# Patient Record
Sex: Female | Born: 1980 | Race: White | Hispanic: No | Marital: Single | Smoking: Never smoker
Health system: Southern US, Community
[De-identification: ages and names within clinical notes are randomized; demographics above are authoritative.]

## PROBLEM LIST (undated history)

## (undated) DIAGNOSIS — M5430 Sciatica, unspecified side: Secondary | ICD-10-CM

## (undated) DIAGNOSIS — E739 Lactose intolerance, unspecified: Secondary | ICD-10-CM

## (undated) DIAGNOSIS — G43909 Migraine, unspecified, not intractable, without status migrainosus: Secondary | ICD-10-CM

## (undated) HISTORY — DX: Lactose intolerance, unspecified: E73.9

## (undated) HISTORY — DX: Migraine, unspecified, not intractable, without status migrainosus: G43.909

## (undated) HISTORY — DX: Sciatica, unspecified side: M54.30

---

## 2008-11-01 LAB — HM PAP SMEAR

## 2013-09-21 ENCOUNTER — Ambulatory Visit: Payer: 59 | Admitting: Family Medicine

## 2013-11-01 ENCOUNTER — Telehealth: Payer: Self-pay

## 2013-11-01 NOTE — Telephone Encounter (Signed)
Left message for call back Identifiable  NEW PATIENT     

## 2013-11-01 NOTE — Telephone Encounter (Signed)
Patient called returning your phone call. Please advise.  

## 2013-11-01 NOTE — Telephone Encounter (Signed)
Medication and allergies:  Reviewed and updated  90 day supply/mail order: n/a [facility]  [id number] - [place], [place] - [address]   Immunizations due:  UTD   A/P: Personal, family history and past surgical hx: Reviewed and updated PAP- 4-5 years ago--normal per patient Flu- 02/2013 per patient; received at work Tdap- within the last 10 years per patient  Patient plans to fax recent lab results to office.   To Discuss with Provider: Nothing at this time.

## 2013-11-02 ENCOUNTER — Other Ambulatory Visit (HOSPITAL_COMMUNITY)
Admission: RE | Admit: 2013-11-02 | Discharge: 2013-11-02 | Disposition: A | Discharge status: Home / Self Care | Payer: 59 | Source: Ambulatory Visit | Attending: Family Medicine | Admitting: Family Medicine

## 2013-11-02 ENCOUNTER — Ambulatory Visit (INDEPENDENT_AMBULATORY_CARE_PROVIDER_SITE_OTHER): Payer: 59 | Admitting: Family Medicine

## 2013-11-02 ENCOUNTER — Encounter: Payer: Self-pay | Admitting: Family Medicine

## 2013-11-02 VITALS — BP 130/70 | HR 89 | Temp 98.1°F | Ht 66.0 in | Wt 153.4 lb

## 2013-11-02 DIAGNOSIS — Z01419 Encounter for gynecological examination (general) (routine) without abnormal findings: Secondary | ICD-10-CM | POA: Insufficient documentation

## 2013-11-02 DIAGNOSIS — Z1151 Encounter for screening for human papillomavirus (HPV): Secondary | ICD-10-CM | POA: Insufficient documentation

## 2013-11-02 DIAGNOSIS — Z0000 Encounter for general adult medical examination without abnormal findings: Secondary | ICD-10-CM

## 2013-11-02 NOTE — Patient Instructions (Signed)

## 2013-11-02 NOTE — Progress Notes (Signed)
Subjective:     Amanda Wood is a 33 y.o. female and is here for a comprehensive physical exam. The patient reports no problems.  History   Social History  . Marital Status: Single    Spouse Name: N/A    Number of Children: N/A  . Years of Education: N/A   Occupational History  . Not on file.   Social History Main Topics  . Smoking status: Never Smoker   . Smokeless tobacco: Not on file  . Alcohol Use: Yes     Comment: 1-2 glass of wine a week  . Drug Use: No  . Sexual Activity: Not on file   Other Topics Concern  . Not on file   Social History Narrative  . No narrative on file   Health Maintenance  Topic Date Due  . Tetanus/tdap  09/06/1997  . Pap Smear  11/02/2011  . Influenza Vaccine  01/19/2014    The following portions of the patient's history were reviewed and updated as appropriate:  She  has a past medical history of Sciatica; Migraines; and Lactose intolerance. She  does not have a problem list on file. She  has no past surgical history on file. Her family history includes Dementia in her maternal grandmother; Hypertension in her father; Stroke in her paternal grandmother. She  reports that she has never smoked. She does not have any smokeless tobacco history on file. She reports that she drinks alcohol. She reports that she does not use illicit drugs. She has a current medication list which includes the following prescription(s): multivitamin with minerals. Current Outpatient Prescriptions on File Prior to Visit  Medication Sig Dispense Refill  . Multiple Vitamins-Minerals (MULTIVITAMIN WITH MINERALS) tablet Take 1 tablet by mouth daily.       No current facility-administered medications on file prior to visit.   She has No Known Allergies..  Review of Systems Review of Systems  Constitutional: Negative for activity change, appetite change and fatigue.  HENT: Negative for hearing loss, congestion, tinnitus and ear discharge.  dentist q1647m Eyes:  Negative for visual disturbance (see optho q1y -- vision corrected to 20/20 with glasses).  Respiratory: Negative for cough, chest tightness and shortness of breath.   Cardiovascular: Negative for chest pain, palpitations and leg swelling.  Gastrointestinal: Negative for abdominal pain, diarrhea, constipation and abdominal distention.  Genitourinary: Negative for urgency, frequency, decreased urine volume and difficulty urinating.  Musculoskeletal: Negative for back pain, arthralgias and gait problem.  Skin: Negative for color change, pallor and rash.  Neurological: Negative for dizziness, light-headedness, numbness and headaches.  Hematological: Negative for adenopathy. Does not bruise/bleed easily.  Psychiatric/Behavioral: Negative for suicidal ideas, confusion, sleep disturbance, self-injury, dysphoric mood, decreased concentration and agitation.       Objective:    BP 130/70  Pulse 89  Temp(Src) 98.1 F (36.7 C) (Oral)  Ht 5\' 6"  (1.676 m)  Wt 153 lb 6.4 oz (69.582 kg)  BMI 24.77 kg/m2  SpO2 96%  LMP 10/07/2013 General appearance: alert, cooperative, appears stated age and no distress Head: Normocephalic, without obvious abnormality, atraumatic Eyes: conjunctivae/corneas clear. PERRL, EOM's intact. Fundi benign. Ears: normal TM's and external ear canals both ears Nose: Nares normal. Septum midline. Mucosa normal. No drainage or sinus tenderness. Throat: lips, mucosa, and tongue normal; teeth and gums normal Neck: no adenopathy, no carotid bruit, no JVD, supple, symmetrical, trachea midline and thyroid not enlarged, symmetric, no tenderness/mass/nodules Back: symmetric, no curvature. ROM normal. No CVA tenderness. Lungs: clear to auscultation bilaterally  Breasts: normal appearance, no masses or tenderness Heart: S1, S2 normal Abdomen: soft, non-tender; bowel sounds normal; no masses,  no organomegaly Pelvic: cervix normal in appearance, external genitalia normal, no adnexal  masses or tenderness, no cervical motion tenderness, rectovaginal septum normal, uterus normal size, shape, and consistency, vagina normal without discharge and pap done Extremities: extremities normal, atraumatic, no cyanosis or edema Pulses: 2+ and symmetric Skin: Skin color, texture, turgor normal. No rashes or lesions Lymph nodes: Cervical, supraclavicular, and axillary nodes normal. Neurologic: Alert and oriented X 3, normal strength and tone. Normal symmetric reflexes. Normal coordination and gait Psych-- no depression. No anxiety      Assessment:    Healthy female exam.      Plan:    ghm utd Check labs--- labs from drs day reveiwed  See After Visit Summary for Counseling Recommendations

## 2013-11-02 NOTE — Progress Notes (Signed)
Pre visit review using our clinic review tool, if applicable. No additional management support is needed unless otherwise documented below in the visit note. 

## 2013-11-19 ENCOUNTER — Encounter: Payer: Self-pay | Admitting: Family Medicine

## 2014-01-21 ENCOUNTER — Encounter: Payer: Self-pay | Admitting: Family Medicine

## 2014-01-29 ENCOUNTER — Encounter: Payer: Self-pay | Admitting: Family Medicine

## 2014-01-29 ENCOUNTER — Ambulatory Visit (INDEPENDENT_AMBULATORY_CARE_PROVIDER_SITE_OTHER): Payer: 59 | Admitting: Family Medicine

## 2014-01-29 ENCOUNTER — Other Ambulatory Visit (HOSPITAL_COMMUNITY)
Admission: RE | Admit: 2014-01-29 | Discharge: 2014-01-29 | Disposition: A | Discharge status: Home / Self Care | Payer: 59 | Source: Ambulatory Visit | Attending: Family Medicine | Admitting: Family Medicine

## 2014-01-29 VITALS — BP 120/70 | HR 73 | Temp 98.4°F | Wt 152.0 lb

## 2014-01-29 DIAGNOSIS — G43829 Menstrual migraine, not intractable, without status migrainosus: Secondary | ICD-10-CM

## 2014-01-29 DIAGNOSIS — Z124 Encounter for screening for malignant neoplasm of cervix: Secondary | ICD-10-CM

## 2014-01-29 DIAGNOSIS — Z30011 Encounter for initial prescription of contraceptive pills: Secondary | ICD-10-CM

## 2014-01-29 DIAGNOSIS — Z01419 Encounter for gynecological examination (general) (routine) without abnormal findings: Secondary | ICD-10-CM | POA: Diagnosis present

## 2014-01-29 DIAGNOSIS — Z3009 Encounter for other general counseling and advice on contraception: Secondary | ICD-10-CM

## 2014-01-29 DIAGNOSIS — R87616 Satisfactory cervical smear but lacking transformation zone: Secondary | ICD-10-CM

## 2014-01-29 MED ORDER — ZOLMITRIPTAN 5 MG PO TABS: 5.0000 mg | ORAL_TABLET | ORAL | Status: DC | PRN

## 2014-01-29 MED ORDER — LEVONORGEST-ETH ESTRAD 91-DAY 0.15-0.03 &0.01 MG PO TABS: 1.0000 | ORAL_TABLET | Freq: Every day | ORAL | Status: DC

## 2014-01-29 NOTE — Progress Notes (Signed)
  Subjective:     Amanda Wood is a 33 y.o. woman who comes in today for a  pap smear only. Her most recent annual exam was on 11/02/2013. Her most recent Pap smear was on 11/02/2013 and showed no EC cells. Previous abnormal Pap smears: no. Contraception: OCP (estrogen/progesterone) Pt also c/o menstrual migraines-- that are worsening.  Occurs every month.   The following portions of the patient's history were reviewed and updated as appropriate: allergies, current medications, past family history, past medical history, past social history, past surgical history and problem list.  Review of Systems Pertinent items are noted in HPI.   Objective:    BP 120/70  Pulse 73  Temp(Src) 98.4 F (36.9 C) (Oral)  Wt 152 lb (68.947 kg)  SpO2 97%  LMP 01/06/2014 Pelvic Exam: cervix normal in appearance, external genitalia normal, urethra without abnormality or discharge, vagina normal without discharge and repeat pap done. Pap smear obtained.   Assessment:    Screening pap smear.   Plan:    Follow up in 6 months, or as indicated by Pap results.   1. Encounter for initial prescription of contraceptive pills  - Levonorgestrel-Ethinyl Estradiol (AMETHIA,CAMRESE) 0.15-0.03 &0.01 MG tablet; Take 1 tablet by mouth daily.  Dispense: 1 Package; Refill: 4  2. Menstrual migraine without status migrainosus, not intractable seasonique - zolmitriptan (ZOMIG) 5 MG tablet; Take 1 tablet (5 mg total) by mouth as needed for migraine.  Dispense: 10 tablet; Refill: 0  3. Encounter for routine gynecological examination

## 2014-01-29 NOTE — Addendum Note (Signed)
Addended by: Arnette NorrisPAYNE, Adaley Kiene P on: 01/29/2014 05:12 PM   Modules accepted: Orders

## 2014-01-29 NOTE — Progress Notes (Signed)
Pre visit review using our clinic review tool, if applicable. No additional management support is needed unless otherwise documented below in the visit note. 

## 2014-01-29 NOTE — Patient Instructions (Signed)
Pap Test A Pap test is a procedure done in a clinic office to evaluate cells that are on the surface of the cervix. The cervix is the lower portion of the uterus and upper portion of the vagina. For some women, the cervical region has the potential to form cancer. With consistent evaluations by your caregiver, this type of cancer can be prevented.  If a Pap test is abnormal, it is most often a result of a previous exposure to human papillomavirus (HPV). HPV is a virus that can infect the cells of the cervix and cause dysplasia. Dysplasia is where the cells no longer look normal. If a woman has been diagnosed with high-grade or severe dysplasia, they are at higher risk of developing cervical cancer. People diagnosed with low-grade dysplasia should still be seen by their caregiver because there is a small chance that low-grade dysplasia could develop into cancer.  LET YOUR CAREGIVER KNOW ABOUT:  Recent sexually transmitted infection (STI) you have had.  Any new sex partners you have had.  History of previous abnormal Pap tests results.  History of previous cervical procedures you have had (colposcopy, biopsy, loop electrosurgical excision procedure [LEEP]).  Concerns you have had regarding unusual vaginal discharge.  History of pelvic pain.  Your use of birth control. BEFORE THE PROCEDURE  Ask your caregiver when to schedule your Pap test. It is best not to be on your period if your caregiver uses a wooden spatula to collect cells or applies cells to a glass slide. Newer techniques are not so sensitive to the timing of a menstrual cycle.  Do not douche or have sexual intercourse for 24 hours before the test.   Do not use vaginal creams or tampons for 24 hours before the test.   Empty your bladder just before the test to lessen any discomfort.  PROCEDURE You will lie on an exam table with your feet in stirrups. A warm metal or plastic instrument (speculum) is placed in your vagina. This  instrument allows your caregiver to see the inside of your vagina and look at your cervix. A small, plastic brush or wooden spatula is then used to collect cervical cells. These cells are placed in a lab specimen container. The cells are looked at under a microscope. A specialist will determine if the cells are normal.  AFTER THE PROCEDURE Make sure to get your test results.If your results come back abnormal, you may need further testing.  Document Released: 08/28/2002 Document Revised: 08/30/2011 Document Reviewed: 06/03/2011 [employer] Patient Information 2015 [employer]. This information is not intended to replace advice given to you by your health care provider. Make sure you discuss any questions you have with your health care provider.  

## 2014-02-01 LAB — CYTOLOGY - PAP

## 2014-07-25 ENCOUNTER — Other Ambulatory Visit: Payer: Self-pay

## 2014-07-25 DIAGNOSIS — G43829 Menstrual migraine, not intractable, without status migrainosus: Secondary | ICD-10-CM

## 2014-07-25 MED ORDER — ZOLMITRIPTAN 5 MG PO TABS: 5.0000 mg | ORAL_TABLET | ORAL | Status: DC | PRN

## 2014-09-22 ENCOUNTER — Encounter: Payer: Self-pay | Admitting: Family Medicine

## 2014-09-23 ENCOUNTER — Other Ambulatory Visit: Payer: Self-pay | Admitting: Family Medicine

## 2014-09-23 DIAGNOSIS — Z0000 Encounter for general adult medical examination without abnormal findings: Secondary | ICD-10-CM

## 2014-09-23 NOTE — Telephone Encounter (Signed)
Order for labs can be faxed to [facility] if she wants to go there  Or we can arrange easily for her to go to [facility]---  Bmp, hep, lipid, cbc, tsh

## 2014-10-18 ENCOUNTER — Telehealth: Payer: Self-pay | Admitting: Family Medicine

## 2014-10-18 NOTE — Telephone Encounter (Signed)
Pre visit letter for annual exam mailed °

## 2014-11-01 ENCOUNTER — Other Ambulatory Visit: Payer: Self-pay | Admitting: Family Medicine

## 2014-11-01 DIAGNOSIS — Z0000 Encounter for general adult medical examination without abnormal findings: Secondary | ICD-10-CM

## 2014-11-01 MED ORDER — NONFORMULARY OR COMPOUNDED ITEM: Status: DC

## 2014-11-07 ENCOUNTER — Encounter: Payer: Self-pay | Admitting: *Deleted

## 2014-11-07 ENCOUNTER — Telehealth: Payer: Self-pay | Admitting: *Deleted

## 2014-11-07 NOTE — Telephone Encounter (Signed)
Unable to reach patient at time of Pre-Visit Call.  Left message for patient to return call when available.    

## 2014-11-08 ENCOUNTER — Ambulatory Visit (INDEPENDENT_AMBULATORY_CARE_PROVIDER_SITE_OTHER): Payer: 59 | Admitting: Family Medicine

## 2014-11-08 ENCOUNTER — Encounter: Payer: Self-pay | Admitting: Family Medicine

## 2014-11-08 VITALS — BP 136/89 | HR 98 | Temp 98.2°F | Resp 18 | Ht 67.5 in | Wt 158.0 lb

## 2014-11-08 DIAGNOSIS — Z0000 Encounter for general adult medical examination without abnormal findings: Secondary | ICD-10-CM | POA: Diagnosis not present

## 2014-11-08 DIAGNOSIS — G43829 Menstrual migraine, not intractable, without status migrainosus: Secondary | ICD-10-CM

## 2014-11-08 MED ORDER — ZOLMITRIPTAN 5 MG PO TABS: 5.0000 mg | ORAL_TABLET | ORAL | Status: DC | PRN

## 2014-11-08 MED ORDER — DROSPIRENONE-ETHINYL ESTRADIOL 3-0.02 MG PO TABS: 1.0000 | ORAL_TABLET | Freq: Every day | ORAL | Status: DC

## 2014-11-08 MED ORDER — NONFORMULARY OR COMPOUNDED ITEM: Status: DC

## 2014-11-08 NOTE — Progress Notes (Signed)
Subjective:     Amanda Wood is a 34 y.o. female and is here for a comprehensive physical exam. The patient reports no problems.  History   Social History  . Marital Status: Single    Spouse Name: N/A  . Number of Children: N/A  . Years of Education: N/A   Occupational History  . Not on file.   Social History Main Topics  . Smoking status: Never Smoker   . Smokeless tobacco: Not on file  . Alcohol Use: Yes     Comment: 1-2 glass of wine a week  . Drug Use: No  . Sexual Activity: Not on file   Other Topics Concern  . Not on file   Social History Narrative   Health Maintenance  Topic Date Due  . HIV Screening  09/06/1993  . TETANUS/TDAP  09/06/1997  . INFLUENZA VACCINE  01/20/2015  . PAP SMEAR  01/29/2017    The following portions of the patient's history were reviewed and updated as appropriate:  She  has a past medical history of Sciatica; Migraines; and Lactose intolerance. She  does not have any pertinent problems on file. She  has no past surgical history on file. Her family history includes Dementia in her maternal grandmother; Hypertension in her father; Stroke in her paternal grandmother. She  reports that she has never smoked. She does not have any smokeless tobacco history on file. She reports that she drinks alcohol. She reports that she does not use illicit drugs. She has a current medication list which includes the following prescription(s): drospirenone-ethinyl estradiol, multivitamin with minerals, NONFORMULARY OR COMPOUNDED ITEM, and zolmitriptan. Current Outpatient Prescriptions on File Prior to Visit  Medication Sig Dispense Refill  . Multiple Vitamins-Minerals (MULTIVITAMIN WITH MINERALS) tablet Take 1 tablet by mouth daily.     No current facility-administered medications on file prior to visit.   She has No Known Allergies..  Review of Systems Review of Systems  Constitutional: Negative for activity change, appetite change and fatigue.   HENT: Negative for hearing loss, congestion, tinnitus and ear discharge.  dentist [id number] Eyes: Negative for visual disturbance (see optho q1y -- vision corrected to 20/20 with glasses).  Respiratory: Negative for cough, chest tightness and shortness of breath.   Cardiovascular: Negative for chest pain, palpitations and leg swelling.  Gastrointestinal: Negative for abdominal pain, diarrhea, constipation and abdominal distention.  Genitourinary: Negative for urgency, frequency, decreased urine volume and difficulty urinating.  Musculoskeletal: Negative for back pain, arthralgias and gait problem.  Skin: Negative for color change, pallor and rash.  Neurological: Negative for dizziness, light-headedness, numbness and headaches.  Hematological: Negative for adenopathy. Does not bruise/bleed easily.  Psychiatric/Behavioral: Negative for suicidal ideas, confusion, sleep disturbance, self-injury, dysphoric mood, decreased concentration and agitation.       Objective:    BP 136/89 mmHg  Pulse 98  Temp(Src) 98.2 F (36.8 C) (Oral)  Resp 18  Ht 5' 7.5" (1.715 m)  Wt 158 lb (71.668 kg)  BMI 24.37 kg/m2  SpO2 100%  LMP 10/25/2014 General appearance: alert, cooperative, appears stated age and no distress Head: Normocephalic, without obvious abnormality, atraumatic Eyes: conjunctivae/corneas clear. PERRL, EOM's intact. Fundi benign. Ears: normal TM's and external ear canals both ears Nose: Nares normal. Septum midline. Mucosa normal. No drainage or sinus tenderness. Throat: lips, mucosa, and tongue normal; teeth and gums normal Neck: no adenopathy, no carotid bruit, no JVD, supple, symmetrical, trachea midline and thyroid not enlarged, symmetric, no tenderness/mass/nodules Back: symmetric, no curvature. ROM normal.  No CVA tenderness. Lungs: clear to auscultation bilaterally Breasts: normal appearance, no masses or tenderness Heart: regular rate and rhythm, S1, S2 normal, no murmur, click, rub  or gallop Abdomen: soft, non-tender; bowel sounds normal; no masses,  no organomegaly Pelvic: deferred Extremities: extremities normal, atraumatic, no cyanosis or edema Pulses: 2+ and symmetric Skin: Skin color, texture, turgor normal. No rashes or lesions Lymph nodes: Cervical, supraclavicular, and axillary nodes normal. Neurologic: Alert and oriented X 3, normal strength and tone. Normal symmetric reflexes. Normal coordination and gait Psych-- no depression, no anxiety      Assessment:    Healthy female exam.      Plan:    ghm utd Check labs See After Visit Summary for Counseling Recommendations   1. Menstrual migraine without status migrainosus, not intractable   - zolmitriptan (ZOMIG) 5 MG tablet; Take 1 tablet (5 mg total) by mouth as needed for migraine.  Dispense: 10 tablet; Refill: 0 - HIV antibody (with reflex) - drospirenone-ethinyl estradiol (YAZ) 3-0.02 MG tablet; Take 1 tablet by mouth daily.  Dispense: 3 Package; Refill: 3  2. Preventative health care See avs and see above - NONFORMULARY OR COMPOUNDED ITEM; Dx complete physical exam--lipid, hep, cbcd, tsh, hiv (reflex),bmp, uA  Dispense: 1 each; Refill: 0

## 2014-11-08 NOTE — Progress Notes (Signed)
Pre visit review using our clinic review tool, if applicable. No additional management support is needed unless otherwise documented below in the visit note. 

## 2014-11-08 NOTE — Patient Instructions (Signed)
Preventive Care for Adults A healthy lifestyle and preventive care can promote health and wellness. Preventive health guidelines for women include the following key practices.  A routine yearly physical is a good way to check with your health care provider about your health and preventive screening. It is a chance to share any concerns and updates on your health and to receive a thorough exam.  Visit your dentist for a routine exam and preventive care every 6 months. Brush your teeth twice a day and floss once a day. Good oral hygiene prevents tooth decay and gum disease.  The frequency of eye exams is based on your age, health, family medical history, use of contact lenses, and other factors. Follow your health care provider's recommendations for frequency of eye exams.  Eat a healthy diet. Foods like vegetables, fruits, whole grains, low-fat dairy products, and lean protein foods contain the nutrients you need without too many calories. Decrease your intake of foods high in solid fats, added sugars, and salt. Eat the right amount of calories for you.Get information about a proper diet from your health care provider, if necessary.  Regular physical exercise is one of the most important things you can do for your health. Most adults should get at least 150 minutes of moderate-intensity exercise (any activity that increases your heart rate and causes you to sweat) each week. In addition, most adults need muscle-strengthening exercises on 2 or more days a week.  Maintain a healthy weight. The body mass index (BMI) is a screening tool to identify possible weight problems. It provides an estimate of body fat based on height and weight. Your health care provider can find your BMI and can help you achieve or maintain a healthy weight.For adults 20 years and older:  A BMI below 18.5 is considered underweight.  A BMI of 18.5 to 24.9 is normal.  A BMI of 25 to 29.9 is considered overweight.  A BMI of  30 and above is considered obese.  Maintain normal blood lipids and cholesterol levels by exercising and minimizing your intake of saturated fat. Eat a balanced diet with plenty of fruit and vegetables. Blood tests for lipids and cholesterol should begin at age 76 and be repeated every 5 years. If your lipid or cholesterol levels are high, you are over 50, or you are at high risk for heart disease, you may need your cholesterol levels checked more frequently.Ongoing high lipid and cholesterol levels should be treated with medicines if diet and exercise are not working.  If you smoke, find out from your health care provider how to quit. If you do not use tobacco, do not start.  Lung cancer screening is recommended for adults aged 22-80 years who are at high risk for developing lung cancer because of a history of smoking. A yearly low-dose CT scan of the lungs is recommended for people who have at least a 30-pack-year history of smoking and are a current smoker or have quit within the past 15 years. A pack year of smoking is smoking an average of 1 pack of cigarettes a day for 1 year (for example: 1 pack a day for 30 years or 2 packs a day for 15 years). Yearly screening should continue until the smoker has stopped smoking for at least 15 years. Yearly screening should be stopped for people who develop a health problem that would prevent them from having lung cancer treatment.  If you are pregnant, do not drink alcohol. If you are breastfeeding,  be very cautious about drinking alcohol. If you are not pregnant and choose to drink alcohol, do not have more than 1 drink per day. One drink is considered to be 12 ounces (355 mL) of beer, 5 ounces (148 mL) of wine, or 1.5 ounces (44 mL) of liquor.  Avoid use of street drugs. Do not share needles with anyone. Ask for help if you need support or instructions about stopping the use of drugs.  High blood pressure causes heart disease and increases the risk of  stroke. Your blood pressure should be checked at least every 1 to 2 years. Ongoing high blood pressure should be treated with medicines if weight loss and exercise do not work.  If you are 75-52 years old, ask your health care provider if you should take aspirin to prevent strokes.  Diabetes screening involves taking a blood sample to check your fasting blood sugar level. This should be done once every 3 years, after age 15, if you are within normal weight and without risk factors for diabetes. Testing should be considered at a younger age or be carried out more frequently if you are overweight and have at least 1 risk factor for diabetes.  Breast cancer screening is essential preventive care for women. You should practice "breast self-awareness." This means understanding the normal appearance and feel of your breasts and may include breast self-examination. Any changes detected, no matter how small, should be reported to a health care provider. Women in their 58s and 30s should have a clinical breast exam (CBE) by a health care provider as part of a regular health exam every 1 to 3 years. After age 16, women should have a CBE every year. Starting at age 53, women should consider having a mammogram (breast X-ray test) every year. Women who have a family history of breast cancer should talk to their health care provider about genetic screening. Women at a high risk of breast cancer should talk to their health care providers about having an MRI and a mammogram every year.  Breast cancer gene (BRCA)-related cancer risk assessment is recommended for women who have family members with BRCA-related cancers. BRCA-related cancers include breast, ovarian, tubal, and peritoneal cancers. Having family members with these cancers may be associated with an increased risk for harmful changes (mutations) in the breast cancer genes BRCA1 and BRCA2. Results of the assessment will determine the need for genetic counseling and  BRCA1 and BRCA2 testing.  Routine pelvic exams to screen for cancer are no longer recommended for nonpregnant women who are considered low risk for cancer of the pelvic organs (ovaries, uterus, and vagina) and who do not have symptoms. Ask your health care provider if a screening pelvic exam is right for you.  If you have had past treatment for cervical cancer or a condition that could lead to cancer, you need Pap tests and screening for cancer for at least 20 years after your treatment. If Pap tests have been discontinued, your risk factors (such as having a new sexual partner) need to be reassessed to determine if screening should be resumed. Some women have medical problems that increase the chance of getting cervical cancer. In these cases, your health care provider may recommend more frequent screening and Pap tests.  The HPV test is an additional test that may be used for cervical cancer screening. The HPV test looks for the virus that can cause the cell changes on the cervix. The cells collected during the Pap test can be  tested for HPV. The HPV test could be used to screen women aged 30 years and older, and should be used in women of any age who have unclear Pap test results. After the age of 30, women should have HPV testing at the same frequency as a Pap test.  Colorectal cancer can be detected and often prevented. Most routine colorectal cancer screening begins at the age of 50 years and continues through age 75 years. However, your health care provider may recommend screening at an earlier age if you have risk factors for colon cancer. On a yearly basis, your health care provider may provide home test kits to check for hidden blood in the stool. Use of a small camera at the end of a tube, to directly examine the colon (sigmoidoscopy or colonoscopy), can detect the earliest forms of colorectal cancer. Talk to your health care provider about this at age 50, when routine screening begins. Direct  exam of the colon should be repeated every 5-10 years through age 75 years, unless early forms of pre-cancerous polyps or small growths are found.  People who are at an increased risk for hepatitis B should be screened for this virus. You are considered at high risk for hepatitis B if:  You were born in a country where hepatitis B occurs often. Talk with your health care provider about which countries are considered high risk.  Your parents were born in a high-risk country and you have not received a shot to protect against hepatitis B (hepatitis B vaccine).  You have HIV or AIDS.  You use needles to inject street drugs.  You live with, or have sex with, someone who has hepatitis B.  You get hemodialysis treatment.  You take certain medicines for conditions like cancer, organ transplantation, and autoimmune conditions.  Hepatitis C blood testing is recommended for all people born from 1945 through 1965 and any individual with known risks for hepatitis C.  Practice safe sex. Use condoms and avoid high-risk sexual practices to reduce the spread of sexually transmitted infections (STIs). STIs include gonorrhea, chlamydia, syphilis, trichomonas, herpes, HPV, and human immunodeficiency virus (HIV). Herpes, HIV, and HPV are viral illnesses that have no cure. They can result in disability, cancer, and death.  You should be screened for sexually transmitted illnesses (STIs) including gonorrhea and chlamydia if:  You are sexually active and are younger than 24 years.  You are older than 24 years and your health care provider tells you that you are at risk for this type of infection.  Your sexual activity has changed since you were last screened and you are at an increased risk for chlamydia or gonorrhea. Ask your health care provider if you are at risk.  If you are at risk of being infected with HIV, it is recommended that you take a prescription medicine daily to prevent HIV infection. This is  called preexposure prophylaxis (PrEP). You are considered at risk if:  You are a heterosexual woman, are sexually active, and are at increased risk for HIV infection.  You take drugs by injection.  You are sexually active with a partner who has HIV.  Talk with your health care provider about whether you are at high risk of being infected with HIV. If you choose to begin PrEP, you should first be tested for HIV. You should then be tested every 3 months for as long as you are taking PrEP.  Osteoporosis is a disease in which the bones lose minerals and strength   with aging. This can result in serious bone fractures or breaks. The risk of osteoporosis can be identified using a bone density scan. Women ages 65 years and over and women at risk for fractures or osteoporosis should discuss screening with their health care providers. Ask your health care provider whether you should take a calcium supplement or vitamin D to reduce the rate of osteoporosis.  Menopause can be associated with physical symptoms and risks. Hormone replacement therapy is available to decrease symptoms and risks. You should talk to your health care provider about whether hormone replacement therapy is right for you.  Use sunscreen. Apply sunscreen liberally and repeatedly throughout the day. You should seek shade when your shadow is shorter than you. Protect yourself by wearing long sleeves, pants, a wide-brimmed hat, and sunglasses year round, whenever you are outdoors.  Once a month, do a whole body skin exam, using a mirror to look at the skin on your back. Tell your health care provider of new moles, moles that have irregular borders, moles that are larger than a pencil eraser, or moles that have changed in shape or color.  Stay current with required vaccines (immunizations).  Influenza vaccine. All adults should be immunized every year.  Tetanus, diphtheria, and acellular pertussis (Td, Tdap) vaccine. Pregnant women should  receive 1 dose of Tdap vaccine during each pregnancy. The dose should be obtained regardless of the length of time since the last dose. Immunization is preferred during the 27th-36th week of gestation. An adult who has not previously received Tdap or who does not know her vaccine status should receive 1 dose of Tdap. This initial dose should be followed by tetanus and diphtheria toxoids (Td) booster doses every 10 years. Adults with an unknown or incomplete history of completing a 3-dose immunization series with Td-containing vaccines should begin or complete a primary immunization series including a Tdap dose. Adults should receive a Td booster every 10 years.  Varicella vaccine. An adult without evidence of immunity to varicella should receive 2 doses or a second dose if she has previously received 1 dose. Pregnant females who do not have evidence of immunity should receive the first dose after pregnancy. This first dose should be obtained before leaving the health care facility. The second dose should be obtained 4-8 weeks after the first dose.  Human papillomavirus (HPV) vaccine. Females aged 13-26 years who have not received the vaccine previously should obtain the 3-dose series. The vaccine is not recommended for use in pregnant females. However, pregnancy testing is not needed before receiving a dose. If a female is found to be pregnant after receiving a dose, no treatment is needed. In that case, the remaining doses should be delayed until after the pregnancy. Immunization is recommended for any person with an immunocompromised condition through the age of 26 years if she did not get any or all doses earlier. During the 3-dose series, the second dose should be obtained 4-8 weeks after the first dose. The third dose should be obtained 24 weeks after the first dose and 16 weeks after the second dose.  Zoster vaccine. One dose is recommended for adults aged 60 years or older unless certain conditions are  present.  Measles, mumps, and rubella (MMR) vaccine. Adults born before 1957 generally are considered immune to measles and mumps. Adults born in 1957 or later should have 1 or more doses of MMR vaccine unless there is a contraindication to the vaccine or there is laboratory evidence of immunity to   each of the three diseases. A routine second dose of MMR vaccine should be obtained at least 28 days after the first dose for students attending postsecondary schools, health care workers, or international travelers. People who received inactivated measles vaccine or an unknown type of measles vaccine during 1963-1967 should receive 2 doses of MMR vaccine. People who received inactivated mumps vaccine or an unknown type of mumps vaccine before 1979 and are at high risk for mumps infection should consider immunization with 2 doses of MMR vaccine. For females of childbearing age, rubella immunity should be determined. If there is no evidence of immunity, females who are not pregnant should be vaccinated. If there is no evidence of immunity, females who are pregnant should delay immunization until after pregnancy. Unvaccinated health care workers born before 1957 who lack laboratory evidence of measles, mumps, or rubella immunity or laboratory confirmation of disease should consider measles and mumps immunization with 2 doses of MMR vaccine or rubella immunization with 1 dose of MMR vaccine.  Pneumococcal 13-valent conjugate (PCV13) vaccine. When indicated, a person who is uncertain of her immunization history and has no record of immunization should receive the PCV13 vaccine. An adult aged 19 years or older who has certain medical conditions and has not been previously immunized should receive 1 dose of PCV13 vaccine. This PCV13 should be followed with a dose of pneumococcal polysaccharide (PPSV23) vaccine. The PPSV23 vaccine dose should be obtained at least 8 weeks after the dose of PCV13 vaccine. An adult aged 19  years or older who has certain medical conditions and previously received 1 or more doses of PPSV23 vaccine should receive 1 dose of PCV13. The PCV13 vaccine dose should be obtained 1 or more years after the last PPSV23 vaccine dose.  Pneumococcal polysaccharide (PPSV23) vaccine. When PCV13 is also indicated, PCV13 should be obtained first. All adults aged 65 years and older should be immunized. An adult younger than age 65 years who has certain medical conditions should be immunized. Any person who resides in a nursing home or long-term care facility should be immunized. An adult smoker should be immunized. People with an immunocompromised condition and certain other conditions should receive both PCV13 and PPSV23 vaccines. People with human immunodeficiency virus (HIV) infection should be immunized as soon as possible after diagnosis. Immunization during chemotherapy or radiation therapy should be avoided. Routine use of PPSV23 vaccine is not recommended for American Indians, [place] Natives, or people younger than 65 years unless there are medical conditions that require PPSV23 vaccine. When indicated, people who have unknown immunization and have no record of immunization should receive PPSV23 vaccine. One-time revaccination 5 years after the first dose of PPSV23 is recommended for people aged 19-64 years who have chronic kidney failure, nephrotic syndrome, asplenia, or immunocompromised conditions. People who received 1-2 doses of PPSV23 before age 65 years should receive another dose of PPSV23 vaccine at age 65 years or later if at least 5 years have passed since the previous dose. Doses of PPSV23 are not needed for people immunized with PPSV23 at or after age 65 years.  Meningococcal vaccine. Adults with asplenia or persistent complement component deficiencies should receive 2 doses of quadrivalent meningococcal conjugate (MenACWY-D) vaccine. The doses should be obtained at least 2 months apart.  Microbiologists working with certain meningococcal bacteria, military recruits, people at risk during an outbreak, and people who travel to or live in countries with a high rate of meningitis should be immunized. A first-year college student up through age   21 years who is living in a residence hall should receive a dose if she did not receive a dose on or after her 16th birthday. Adults who have certain high-risk conditions should receive one or more doses of vaccine.  Hepatitis A vaccine. Adults who wish to be protected from this disease, have certain high-risk conditions, work with hepatitis A-infected animals, work in hepatitis A research labs, or travel to or work in countries with a high rate of hepatitis A should be immunized. Adults who were previously unvaccinated and who anticipate close contact with an international adoptee during the first 60 days after arrival in the [place] from a country with a high rate of hepatitis A should be immunized.  Hepatitis B vaccine. Adults who wish to be protected from this disease, have certain high-risk conditions, may be exposed to blood or other infectious body fluids, are household contacts or sex partners of hepatitis B positive people, are clients or workers in certain care facilities, or travel to or work in countries with a high rate of hepatitis B should be immunized.  Haemophilus influenzae type b (Hib) vaccine. A previously unvaccinated person with asplenia or sickle cell disease or having a scheduled splenectomy should receive 1 dose of Hib vaccine. Regardless of previous immunization, a recipient of a hematopoietic stem cell transplant should receive a 3-dose series 6-12 months after her successful transplant. Hib vaccine is not recommended for adults with HIV infection. Preventive Services / Frequency Ages 64 to 68 years  Blood pressure check.** / Every 1 to 2 years.  Lipid and cholesterol check.** / Every 5 years beginning at age  22.  Clinical breast exam.** / Every 3 years for women in their 88s and 53s.  BRCA-related cancer risk assessment.** / For women who have family members with a BRCA-related cancer (breast, ovarian, tubal, or peritoneal cancers).  Pap test.** / Every 2 years from ages 90 through 51. Every 3 years starting at age 21 through age 56 or 3 with a history of 3 consecutive normal Pap tests.  HPV screening.** / Every 3 years from ages 24 through ages 1 to 46 with a history of 3 consecutive normal Pap tests.  Hepatitis C blood test.** / For any individual with known risks for hepatitis C.  Skin self-exam. / Monthly.  Influenza vaccine. / Every year.  Tetanus, diphtheria, and acellular pertussis (Tdap, Td) vaccine.** / Consult your health care provider. Pregnant women should receive 1 dose of Tdap vaccine during each pregnancy. 1 dose of Td every 10 years.  Varicella vaccine.** / Consult your health care provider. Pregnant females who do not have evidence of immunity should receive the first dose after pregnancy.  HPV vaccine. / 3 doses over 6 months, if 72 and younger. The vaccine is not recommended for use in pregnant females. However, pregnancy testing is not needed before receiving a dose.  Measles, mumps, rubella (MMR) vaccine.** / You need at least 1 dose of MMR if you were born in 1957 or later. You may also need a 2nd dose. For females of childbearing age, rubella immunity should be determined. If there is no evidence of immunity, females who are not pregnant should be vaccinated. If there is no evidence of immunity, females who are pregnant should delay immunization until after pregnancy.  Pneumococcal 13-valent conjugate (PCV13) vaccine.** / Consult your health care provider.  Pneumococcal polysaccharide (PPSV23) vaccine.** / 1 to 2 doses if you smoke cigarettes or if you have certain conditions.  Meningococcal vaccine.** /  1 dose if you are age 19 to 21 years and a first-year college  student living in a residence hall, or have one of several medical conditions, you need to get vaccinated against meningococcal disease. You may also need additional booster doses.  Hepatitis A vaccine.** / Consult your health care provider.  Hepatitis B vaccine.** / Consult your health care provider.  Haemophilus influenzae type b (Hib) vaccine.** / Consult your health care provider. Ages 40 to 64 years  Blood pressure check.** / Every 1 to 2 years.  Lipid and cholesterol check.** / Every 5 years beginning at age 20 years.  Lung cancer screening. / Every year if you are aged 55-80 years and have a 30-pack-year history of smoking and currently smoke or have quit within the past 15 years. Yearly screening is stopped once you have quit smoking for at least 15 years or develop a health problem that would prevent you from having lung cancer treatment.  Clinical breast exam.** / Every year after age 40 years.  BRCA-related cancer risk assessment.** / For women who have family members with a BRCA-related cancer (breast, ovarian, tubal, or peritoneal cancers).  Mammogram.** / Every year beginning at age 40 years and continuing for as long as you are in good health. Consult with your health care provider.  Pap test.** / Every 3 years starting at age 30 years through age 65 or 70 years with a history of 3 consecutive normal Pap tests.  HPV screening.** / Every 3 years from ages 30 years through ages 65 to 70 years with a history of 3 consecutive normal Pap tests.  Fecal occult blood test (FOBT) of stool. / Every year beginning at age 50 years and continuing until age 75 years. You may not need to do this test if you get a colonoscopy every 10 years.  Flexible sigmoidoscopy or colonoscopy.** / Every 5 years for a flexible sigmoidoscopy or every 10 years for a colonoscopy beginning at age 50 years and continuing until age 75 years.  Hepatitis C blood test.** / For all people born from 1945 through  1965 and any individual with known risks for hepatitis C.  Skin self-exam. / Monthly.  Influenza vaccine. / Every year.  Tetanus, diphtheria, and acellular pertussis (Tdap/Td) vaccine.** / Consult your health care provider. Pregnant women should receive 1 dose of Tdap vaccine during each pregnancy. 1 dose of Td every 10 years.  Varicella vaccine.** / Consult your health care provider. Pregnant females who do not have evidence of immunity should receive the first dose after pregnancy.  Zoster vaccine.** / 1 dose for adults aged 60 years or older.  Measles, mumps, rubella (MMR) vaccine.** / You need at least 1 dose of MMR if you were born in 1957 or later. You may also need a 2nd dose. For females of childbearing age, rubella immunity should be determined. If there is no evidence of immunity, females who are not pregnant should be vaccinated. If there is no evidence of immunity, females who are pregnant should delay immunization until after pregnancy.  Pneumococcal 13-valent conjugate (PCV13) vaccine.** / Consult your health care provider.  Pneumococcal polysaccharide (PPSV23) vaccine.** / 1 to 2 doses if you smoke cigarettes or if you have certain conditions.  Meningococcal vaccine.** / Consult your health care provider.  Hepatitis A vaccine.** / Consult your health care provider.  Hepatitis B vaccine.** / Consult your health care provider.  Haemophilus influenzae type b (Hib) vaccine.** / Consult your health care provider. Ages 65   years and over  Blood pressure check.** / Every 1 to 2 years.  Lipid and cholesterol check.** / Every 5 years beginning at age 22 years.  Lung cancer screening. / Every year if you are aged 73-80 years and have a 30-pack-year history of smoking and currently smoke or have quit within the past 15 years. Yearly screening is stopped once you have quit smoking for at least 15 years or develop a health problem that would prevent you from having lung cancer  treatment.  Clinical breast exam.** / Every year after age 4 years.  BRCA-related cancer risk assessment.** / For women who have family members with a BRCA-related cancer (breast, ovarian, tubal, or peritoneal cancers).  Mammogram.** / Every year beginning at age 40 years and continuing for as long as you are in good health. Consult with your health care provider.  Pap test.** / Every 3 years starting at age 9 years through age 34 or 91 years with 3 consecutive normal Pap tests. Testing can be stopped between 65 and 70 years with 3 consecutive normal Pap tests and no abnormal Pap or HPV tests in the past 10 years.  HPV screening.** / Every 3 years from ages 57 years through ages 64 or 45 years with a history of 3 consecutive normal Pap tests. Testing can be stopped between 65 and 70 years with 3 consecutive normal Pap tests and no abnormal Pap or HPV tests in the past 10 years.  Fecal occult blood test (FOBT) of stool. / Every year beginning at age 15 years and continuing until age 17 years. You may not need to do this test if you get a colonoscopy every 10 years.  Flexible sigmoidoscopy or colonoscopy.** / Every 5 years for a flexible sigmoidoscopy or every 10 years for a colonoscopy beginning at age 86 years and continuing until age 71 years.  Hepatitis C blood test.** / For all people born from [birth year] through 1967 and any individual with known risks for hepatitis C.  Osteoporosis screening.** / A one-time screening for women ages 83 years and over and women at risk for fractures or osteoporosis.  Skin self-exam. / Monthly.  Influenza vaccine. / Every year.  Tetanus, diphtheria, and acellular pertussis (Tdap/Td) vaccine.** / 1 dose of Td every 10 years.  Varicella vaccine.** / Consult your health care provider.  Zoster vaccine.** / 1 dose for adults aged 61 years or older.  Pneumococcal 13-valent conjugate (PCV13) vaccine.** / Consult your health care provider.  Pneumococcal  polysaccharide (PPSV23) vaccine.** / 1 dose for all adults aged 28 years and older.  Meningococcal vaccine.** / Consult your health care provider.  Hepatitis A vaccine.** / Consult your health care provider.  Hepatitis B vaccine.** / Consult your health care provider.  Haemophilus influenzae type b (Hib) vaccine.** / Consult your health care provider. ** Family history and personal history of risk and conditions may change your health care provider's recommendations. Document Released: 08/03/2001 Document Revised: 10/22/2013 Document Reviewed: 11/02/2010 [facility] Patient Information 2015 [place], [place]. This information is not intended to replace advice given to you by your health care provider. Make sure you discuss any questions you have with your health care provider.

## 2014-11-15 ENCOUNTER — Other Ambulatory Visit: Payer: 59

## 2014-11-15 ENCOUNTER — Other Ambulatory Visit: Payer: Self-pay | Admitting: *Deleted

## 2014-11-15 ENCOUNTER — Other Ambulatory Visit: Payer: Self-pay | Admitting: Family Medicine

## 2014-11-15 ENCOUNTER — Other Ambulatory Visit: Payer: Self-pay

## 2014-11-15 DIAGNOSIS — Z0000 Encounter for general adult medical examination without abnormal findings: Secondary | ICD-10-CM

## 2015-03-07 ENCOUNTER — Other Ambulatory Visit: Payer: Self-pay

## 2015-03-07 DIAGNOSIS — G43829 Menstrual migraine, not intractable, without status migrainosus: Secondary | ICD-10-CM

## 2015-03-07 MED ORDER — ZOLMITRIPTAN 5 MG PO TABS: 5.0000 mg | ORAL_TABLET | ORAL | Status: DC | PRN

## 2015-07-17 MED FILL — ZOLMitriptan 5 MG TABS: 5 | 30 days supply | Qty: 9 | Fill #2

## 2015-08-18 MED FILL — DROSPIR-ETH ESTRA 3/.02 MG: 3-0.02 | 84 days supply | Qty: 84 | Fill #3

## 2015-08-20 ENCOUNTER — Other Ambulatory Visit: Payer: Self-pay

## 2015-08-20 DIAGNOSIS — G43829 Menstrual migraine, not intractable, without status migrainosus: Secondary | ICD-10-CM

## 2015-08-20 MED ORDER — ZOLMITRIPTAN 5 MG PO TABS: 5.0000 mg | ORAL_TABLET | ORAL | Status: DC | PRN

## 2015-08-20 MED FILL — ZOLMitriptan 5 MG TABS: 5 | 30 days supply | Qty: 9 | Fill #0

## 2015-10-17 ENCOUNTER — Other Ambulatory Visit: Payer: Self-pay | Admitting: Family Medicine

## 2015-10-17 MED FILL — ZOLMitriptan 5 MG TABS: 5 | 30 days supply | Qty: 9 | Fill #1

## 2015-10-20 MED FILL — DROSPIR-ETH ESTRA 3/.02 MG: 3-0.02 | 84 days supply | Qty: 84 | Fill #0

## 2015-11-14 ENCOUNTER — Telehealth: Payer: Self-pay | Admitting: *Deleted

## 2015-11-14 NOTE — Telephone Encounter (Signed)
Unable to reach patient at time of pre-visit call. Left message for patient to return call when available.  

## 2015-11-18 ENCOUNTER — Encounter: Payer: Self-pay | Admitting: Family Medicine

## 2015-11-18 ENCOUNTER — Ambulatory Visit (INDEPENDENT_AMBULATORY_CARE_PROVIDER_SITE_OTHER): Payer: 59 | Admitting: Family Medicine

## 2015-11-18 VITALS — BP 112/74 | HR 80 | Temp 98.4°F | Ht 68.0 in | Wt 158.4 lb

## 2015-11-18 DIAGNOSIS — G43829 Menstrual migraine, not intractable, without status migrainosus: Secondary | ICD-10-CM | POA: Diagnosis not present

## 2015-11-18 DIAGNOSIS — Z0000 Encounter for general adult medical examination without abnormal findings: Secondary | ICD-10-CM | POA: Diagnosis not present

## 2015-11-18 DIAGNOSIS — N943 Premenstrual tension syndrome: Secondary | ICD-10-CM

## 2015-11-18 MED ORDER — ZOLMITRIPTAN 5 MG PO TABS: 5.0000 mg | ORAL_TABLET | ORAL | Status: DC | PRN

## 2015-11-18 NOTE — Progress Notes (Signed)
Pre visit review using our clinic review tool, if applicable. No additional management support is needed unless otherwise documented below in the visit note. 

## 2015-11-18 NOTE — Patient Instructions (Signed)
Preventive Care for Adults, Female A healthy lifestyle and preventive care can promote health and wellness. Preventive health guidelines for women include the following key practices.  A routine yearly physical is a good way to check with your health care provider about your health and preventive screening. It is a chance to share any concerns and updates on your health and to receive a thorough exam.  Visit your dentist for a routine exam and preventive care every 6 months. Brush your teeth twice a day and floss once a day. Good oral hygiene prevents tooth decay and gum disease.  The frequency of eye exams is based on your age, health, family medical history, use of contact lenses, and other factors. Follow your health care provider's recommendations for frequency of eye exams.  Eat a healthy diet. Foods like vegetables, fruits, whole grains, low-fat dairy products, and lean protein foods contain the nutrients you need without too many calories. Decrease your intake of foods high in solid fats, added sugars, and salt. Eat the right amount of calories for you.Get information about a proper diet from your health care provider, if necessary.  Regular physical exercise is one of the most important things you can do for your health. Most adults should get at least 150 minutes of moderate-intensity exercise (any activity that increases your heart rate and causes you to sweat) each week. In addition, most adults need muscle-strengthening exercises on 2 or more days a week.  Maintain a healthy weight. The body mass index (BMI) is a screening tool to identify possible weight problems. It provides an estimate of body fat based on height and weight. Your health care provider can find your BMI and can help you achieve or maintain a healthy weight.For adults 20 years and older:  A BMI below 18.5 is considered underweight.  A BMI of 18.5 to 24.9 is normal.  A BMI of 25 to 29.9 is considered overweight.  A  BMI of 30 and above is considered obese.  Maintain normal blood lipids and cholesterol levels by exercising and minimizing your intake of saturated fat. Eat a balanced diet with plenty of fruit and vegetables. Blood tests for lipids and cholesterol should begin at age 45 and be repeated every 5 years. If your lipid or cholesterol levels are high, you are over 50, or you are at high risk for heart disease, you may need your cholesterol levels checked more frequently.Ongoing high lipid and cholesterol levels should be treated with medicines if diet and exercise are not working.  If you smoke, find out from your health care provider how to quit. If you do not use tobacco, do not start.  Lung cancer screening is recommended for adults aged 45-80 years who are at high risk for developing lung cancer because of a history of smoking. A yearly low-dose CT scan of the lungs is recommended for people who have at least a 30-pack-year history of smoking and are a current smoker or have quit within the past 15 years. A pack year of smoking is smoking an average of 1 pack of cigarettes a day for 1 year (for example: 1 pack a day for 30 years or 2 packs a day for 15 years). Yearly screening should continue until the smoker has stopped smoking for at least 15 years. Yearly screening should be stopped for people who develop a health problem that would prevent them from having lung cancer treatment.  If you are pregnant, do not drink alcohol. If you are  breastfeeding, be very cautious about drinking alcohol. If you are not pregnant and choose to drink alcohol, do not have more than 1 drink per day. One drink is considered to be 12 ounces (355 mL) of beer, 5 ounces (148 mL) of wine, or 1.5 ounces (44 mL) of liquor.  Avoid use of street drugs. Do not share needles with anyone. Ask for help if you need support or instructions about stopping the use of drugs.  High blood pressure causes heart disease and increases the risk  of stroke. Your blood pressure should be checked at least every 1 to 2 years. Ongoing high blood pressure should be treated with medicines if weight loss and exercise do not work.  If you are 55-79 years old, ask your health care provider if you should take aspirin to prevent strokes.  Diabetes screening is done by taking a blood sample to check your blood glucose level after you have not eaten for a certain period of time (fasting). If you are not overweight and you do not have risk factors for diabetes, you should be screened once every 3 years starting at age 45. If you are overweight or obese and you are 40-70 years of age, you should be screened for diabetes every year as part of your cardiovascular risk assessment.  Breast cancer screening is essential preventive care for women. You should practice "breast self-awareness." This means understanding the normal appearance and feel of your breasts and may include breast self-examination. Any changes detected, no matter how small, should be reported to a health care provider. Women in their 20s and 30s should have a clinical breast exam (CBE) by a health care provider as part of a regular health exam every 1 to 3 years. After age 40, women should have a CBE every year. Starting at age 40, women should consider having a mammogram (breast X-ray test) every year. Women who have a family history of breast cancer should talk to their health care provider about genetic screening. Women at a high risk of breast cancer should talk to their health care providers about having an MRI and a mammogram every year.  Breast cancer gene (BRCA)-related cancer risk assessment is recommended for women who have family members with BRCA-related cancers. BRCA-related cancers include breast, ovarian, tubal, and peritoneal cancers. Having family members with these cancers may be associated with an increased risk for harmful changes (mutations) in the breast cancer genes BRCA1 and  BRCA2. Results of the assessment will determine the need for genetic counseling and BRCA1 and BRCA2 testing.  Your health care provider may recommend that you be screened regularly for cancer of the pelvic organs (ovaries, uterus, and vagina). This screening involves a pelvic examination, including checking for microscopic changes to the surface of your cervix (Pap test). You may be encouraged to have this screening done every 3 years, beginning at age 21.  For women ages 30-65, health care providers may recommend pelvic exams and Pap testing every 3 years, or they may recommend the Pap and pelvic exam, combined with testing for human papilloma virus (HPV), every 5 years. Some types of HPV increase your risk of cervical cancer. Testing for HPV may also be done on women of any age with unclear Pap test results.  Other health care providers may not recommend any screening for nonpregnant women who are considered low risk for pelvic cancer and who do not have symptoms. Ask your health care provider if a screening pelvic exam is right for   you.  If you have had past treatment for cervical cancer or a condition that could lead to cancer, you need Pap tests and screening for cancer for at least 20 years after your treatment. If Pap tests have been discontinued, your risk factors (such as having a new sexual partner) need to be reassessed to determine if screening should resume. Some women have medical problems that increase the chance of getting cervical cancer. In these cases, your health care provider may recommend more frequent screening and Pap tests.  Colorectal cancer can be detected and often prevented. Most routine colorectal cancer screening begins at the age of 50 years and continues through age 75 years. However, your health care provider may recommend screening at an earlier age if you have risk factors for colon cancer. On a yearly basis, your health care provider may provide home test kits to check  for hidden blood in the stool. Use of a small camera at the end of a tube, to directly examine the colon (sigmoidoscopy or colonoscopy), can detect the earliest forms of colorectal cancer. Talk to your health care provider about this at age 50, when routine screening begins. Direct exam of the colon should be repeated every 5-10 years through age 75 years, unless early forms of precancerous polyps or small growths are found.  People who are at an increased risk for hepatitis B should be screened for this virus. You are considered at high risk for hepatitis B if:  You were born in a country where hepatitis B occurs often. Talk with your health care provider about which countries are considered high risk.  Your parents were born in a high-risk country and you have not received a shot to protect against hepatitis B (hepatitis B vaccine).  You have HIV or AIDS.  You use needles to inject street drugs.  You live with, or have sex with, someone who has hepatitis B.  You get hemodialysis treatment.  You take certain medicines for conditions like cancer, organ transplantation, and autoimmune conditions.  Hepatitis C blood testing is recommended for all people born from 1945 through 1965 and any individual with known risks for hepatitis C.  Practice safe sex. Use condoms and avoid high-risk sexual practices to reduce the spread of sexually transmitted infections (STIs). STIs include gonorrhea, chlamydia, syphilis, trichomonas, herpes, HPV, and human immunodeficiency virus (HIV). Herpes, HIV, and HPV are viral illnesses that have no cure. They can result in disability, cancer, and death.  You should be screened for sexually transmitted illnesses (STIs) including gonorrhea and chlamydia if:  You are sexually active and are younger than 24 years.  You are older than 24 years and your health care provider tells you that you are at risk for this type of infection.  Your sexual activity has changed  since you were last screened and you are at an increased risk for chlamydia or gonorrhea. Ask your health care provider if you are at risk.  If you are at risk of being infected with HIV, it is recommended that you take a prescription medicine daily to prevent HIV infection. This is called preexposure prophylaxis (PrEP). You are considered at risk if:  You are sexually active and do not regularly use condoms or know the HIV status of your partner(s).  You take drugs by injection.  You are sexually active with a partner who has HIV.  Talk with your health care provider about whether you are at high risk of being infected with HIV. If   you choose to begin PrEP, you should first be tested for HIV. You should then be tested every 3 months for as long as you are taking PrEP.  Osteoporosis is a disease in which the bones lose minerals and strength with aging. This can result in serious bone fractures or breaks. The risk of osteoporosis can be identified using a bone density scan. Women ages 67 years and over and women at risk for fractures or osteoporosis should discuss screening with their health care providers. Ask your health care provider whether you should take a calcium supplement or vitamin D to reduce the rate of osteoporosis.  Menopause can be associated with physical symptoms and risks. Hormone replacement therapy is available to decrease symptoms and risks. You should talk to your health care provider about whether hormone replacement therapy is right for you.  Use sunscreen. Apply sunscreen liberally and repeatedly throughout the day. You should seek shade when your shadow is shorter than you. Protect yourself by wearing long sleeves, pants, a wide-brimmed hat, and sunglasses year round, whenever you are outdoors.  Once a month, do a whole body skin exam, using a mirror to look at the skin on your back. Tell your health care provider of new moles, moles that have irregular borders, moles that  are larger than a pencil eraser, or moles that have changed in shape or color.  Stay current with required vaccines (immunizations).  Influenza vaccine. All adults should be immunized every year.  Tetanus, diphtheria, and acellular pertussis (Td, Tdap) vaccine. Pregnant women should receive 1 dose of Tdap vaccine during each pregnancy. The dose should be obtained regardless of the length of time since the last dose. Immunization is preferred during the 27th-36th week of gestation. An adult who has not previously received Tdap or who does not know her vaccine status should receive 1 dose of Tdap. This initial dose should be followed by tetanus and diphtheria toxoids (Td) booster doses every 10 years. Adults with an unknown or incomplete history of completing a 3-dose immunization series with Td-containing vaccines should begin or complete a primary immunization series including a Tdap dose. Adults should receive a Td booster every 10 years.  Varicella vaccine. An adult without evidence of immunity to varicella should receive 2 doses or a second dose if she has previously received 1 dose. Pregnant females who do not have evidence of immunity should receive the first dose after pregnancy. This first dose should be obtained before leaving the health care facility. The second dose should be obtained 4-8 weeks after the first dose.  Human papillomavirus (HPV) vaccine. Females aged 13-26 years who have not received the vaccine previously should obtain the 3-dose series. The vaccine is not recommended for use in pregnant females. However, pregnancy testing is not needed before receiving a dose. If a female is found to be pregnant after receiving a dose, no treatment is needed. In that case, the remaining doses should be delayed until after the pregnancy. Immunization is recommended for any person with an immunocompromised condition through the age of 61 years if she did not get any or all doses earlier. During the  3-dose series, the second dose should be obtained 4-8 weeks after the first dose. The third dose should be obtained 24 weeks after the first dose and 16 weeks after the second dose.  Zoster vaccine. One dose is recommended for adults aged 30 years or older unless certain conditions are present.  Measles, mumps, and rubella (MMR) vaccine. Adults born  before 1957 generally are considered immune to measles and mumps. Adults born in 1957 or later should have 1 or more doses of MMR vaccine unless there is a contraindication to the vaccine or there is laboratory evidence of immunity to each of the three diseases. A routine second dose of MMR vaccine should be obtained at least 28 days after the first dose for students attending postsecondary schools, health care workers, or international travelers. People who received inactivated measles vaccine or an unknown type of measles vaccine during 1963-1967 should receive 2 doses of MMR vaccine. People who received inactivated mumps vaccine or an unknown type of mumps vaccine before 1979 and are at high risk for mumps infection should consider immunization with 2 doses of MMR vaccine. For females of childbearing age, rubella immunity should be determined. If there is no evidence of immunity, females who are not pregnant should be vaccinated. If there is no evidence of immunity, females who are pregnant should delay immunization until after pregnancy. Unvaccinated health care workers born before 1957 who lack laboratory evidence of measles, mumps, or rubella immunity or laboratory confirmation of disease should consider measles and mumps immunization with 2 doses of MMR vaccine or rubella immunization with 1 dose of MMR vaccine.  Pneumococcal 13-valent conjugate (PCV13) vaccine. When indicated, a person who is uncertain of his immunization history and has no record of immunization should receive the PCV13 vaccine. All adults 65 years of age and older should receive this  vaccine. An adult aged 19 years or older who has certain medical conditions and has not been previously immunized should receive 1 dose of PCV13 vaccine. This PCV13 should be followed with a dose of pneumococcal polysaccharide (PPSV23) vaccine. Adults who are at high risk for pneumococcal disease should obtain the PPSV23 vaccine at least 8 weeks after the dose of PCV13 vaccine. Adults older than 37 years of age who have normal immune system function should obtain the PPSV23 vaccine dose at least 1 year after the dose of PCV13 vaccine.  Pneumococcal polysaccharide (PPSV23) vaccine. When PCV13 is also indicated, PCV13 should be obtained first. All adults aged 65 years and older should be immunized. An adult younger than age 65 years who has certain medical conditions should be immunized. Any person who resides in a nursing home or long-term care facility should be immunized. An adult smoker should be immunized. People with an immunocompromised condition and certain other conditions should receive both PCV13 and PPSV23 vaccines. People with human immunodeficiency virus (HIV) infection should be immunized as soon as possible after diagnosis. Immunization during chemotherapy or radiation therapy should be avoided. Routine use of PPSV23 vaccine is not recommended for American Indians, [place] Natives, or people younger than 65 years unless there are medical conditions that require PPSV23 vaccine. When indicated, people who have unknown immunization and have no record of immunization should receive PPSV23 vaccine. One-time revaccination 5 years after the first dose of PPSV23 is recommended for people aged 19-64 years who have chronic kidney failure, nephrotic syndrome, asplenia, or immunocompromised conditions. People who received 1-2 doses of PPSV23 before age 65 years should receive another dose of PPSV23 vaccine at age 65 years or later if at least 5 years have passed since the previous dose. Doses of PPSV23 are not  needed for people immunized with PPSV23 at or after age 65 years.  Meningococcal vaccine. Adults with asplenia or persistent complement component deficiencies should receive 2 doses of quadrivalent meningococcal conjugate (MenACWY-D) vaccine. The doses should be obtained   at least 2 months apart. Microbiologists working with certain meningococcal bacteria, [place] recruits, people at risk during an outbreak, and people who travel to or live in countries with a high rate of meningitis should be immunized. A first-year college student up through age 34 years who is living in a residence hall should receive a dose if she did not receive a dose on or after her 16th birthday. Adults who have certain high-risk conditions should receive one or more doses of vaccine.  Hepatitis A vaccine. Adults who wish to be protected from this disease, have certain high-risk conditions, work with hepatitis A-infected animals, work in hepatitis A research labs, or travel to or work in countries with a high rate of hepatitis A should be immunized. Adults who were previously unvaccinated and who anticipate close contact with an international adoptee during the first 60 days after arrival in the [place] from a country with a high rate of hepatitis A should be immunized.  Hepatitis B vaccine. Adults who wish to be protected from this disease, have certain high-risk conditions, may be exposed to blood or other infectious body fluids, are household contacts or sex partners of hepatitis B positive people, are clients or workers in certain care facilities, or travel to or work in countries with a high rate of hepatitis B should be immunized.  Haemophilus influenzae type b (Hib) vaccine. A previously unvaccinated person with asplenia or sickle cell disease or having a scheduled splenectomy should receive 1 dose of Hib vaccine. Regardless of previous immunization, a recipient of a hematopoietic stem cell transplant should receive a  3-dose series 6-12 months after her successful transplant. Hib vaccine is not recommended for adults with HIV infection. Preventive Services / Frequency Ages 35 to 4 years  Blood pressure check.** / Every 3-5 years.  Lipid and cholesterol check.** / Every 5 years beginning at age 60.  Clinical breast exam.** / Every 3 years for women in their 71s and 10s.  BRCA-related cancer risk assessment.** / For women who have family members with a BRCA-related cancer (breast, ovarian, tubal, or peritoneal cancers).  Pap test.** / Every 2 years from ages 76 through 26. Every 3 years starting at age 61 through age 76 or 93 with a history of 3 consecutive normal Pap tests.  HPV screening.** / Every 3 years from ages 37 through ages 60 to 51 with a history of 3 consecutive normal Pap tests.  Hepatitis C blood test.** / For any individual with known risks for hepatitis C.  Skin self-exam. / Monthly.  Influenza vaccine. / Every year.  Tetanus, diphtheria, and acellular pertussis (Tdap, Td) vaccine.** / Consult your health care provider. Pregnant women should receive 1 dose of Tdap vaccine during each pregnancy. 1 dose of Td every 10 years.  Varicella vaccine.** / Consult your health care provider. Pregnant females who do not have evidence of immunity should receive the first dose after pregnancy.  HPV vaccine. / 3 doses over 6 months, if 93 and younger. The vaccine is not recommended for use in pregnant females. However, pregnancy testing is not needed before receiving a dose.  Measles, mumps, rubella (MMR) vaccine.** / You need at least 1 dose of MMR if you were born in 1957 or later. You may also need a 2nd dose. For females of childbearing age, rubella immunity should be determined. If there is no evidence of immunity, females who are not pregnant should be vaccinated. If there is no evidence of immunity, females who are  pregnant should delay immunization until after pregnancy.  Pneumococcal  13-valent conjugate (PCV13) vaccine.** / Consult your health care provider.  Pneumococcal polysaccharide (PPSV23) vaccine.** / 1 to 2 doses if you smoke cigarettes or if you have certain conditions.  Meningococcal vaccine.** / 1 dose if you are age 68 to 8 years and a Market researcher living in a residence hall, or have one of several medical conditions, you need to get vaccinated against meningococcal disease. You may also need additional booster doses.  Hepatitis A vaccine.** / Consult your health care provider.  Hepatitis B vaccine.** / Consult your health care provider.  Haemophilus influenzae type b (Hib) vaccine.** / Consult your health care provider. Ages 7 to 53 years  Blood pressure check.** / Every year.  Lipid and cholesterol check.** / Every 5 years beginning at age 25 years.  Lung cancer screening. / Every year if you are aged 11-80 years and have a 30-pack-year history of smoking and currently smoke or have quit within the past 15 years. Yearly screening is stopped once you have quit smoking for at least 15 years or develop a health problem that would prevent you from having lung cancer treatment.  Clinical breast exam.** / Every year after age 48 years.  BRCA-related cancer risk assessment.** / For women who have family members with a BRCA-related cancer (breast, ovarian, tubal, or peritoneal cancers).  Mammogram.** / Every year beginning at age 41 years and continuing for as long as you are in good health. Consult with your health care provider.  Pap test.** / Every 3 years starting at age 65 years through age 37 or 70 years with a history of 3 consecutive normal Pap tests.  HPV screening.** / Every 3 years from ages 72 years through ages 60 to 40 years with a history of 3 consecutive normal Pap tests.  Fecal occult blood test (FOBT) of stool. / Every year beginning at age 21 years and continuing until age 5 years. You may not need to do this test if you get  a colonoscopy every 10 years.  Flexible sigmoidoscopy or colonoscopy.** / Every 5 years for a flexible sigmoidoscopy or every 10 years for a colonoscopy beginning at age 35 years and continuing until age 48 years.  Hepatitis C blood test.** / For all people born from [birth year] through 1965 and any individual with known risks for hepatitis C.  Skin self-exam. / Monthly.  Influenza vaccine. / Every year.  Tetanus, diphtheria, and acellular pertussis (Tdap/Td) vaccine.** / Consult your health care provider. Pregnant women should receive 1 dose of Tdap vaccine during each pregnancy. 1 dose of Td every 10 years.  Varicella vaccine.** / Consult your health care provider. Pregnant females who do not have evidence of immunity should receive the first dose after pregnancy.  Zoster vaccine.** / 1 dose for adults aged 30 years or older.  Measles, mumps, rubella (MMR) vaccine.** / You need at least 1 dose of MMR if you were born in 1957 or later. You may also need a second dose. For females of childbearing age, rubella immunity should be determined. If there is no evidence of immunity, females who are not pregnant should be vaccinated. If there is no evidence of immunity, females who are pregnant should delay immunization until after pregnancy.  Pneumococcal 13-valent conjugate (PCV13) vaccine.** / Consult your health care provider.  Pneumococcal polysaccharide (PPSV23) vaccine.** / 1 to 2 doses if you smoke cigarettes or if you have certain conditions.  Meningococcal vaccine.** /  Consult your health care provider.  Hepatitis A vaccine.** / Consult your health care provider.  Hepatitis B vaccine.** / Consult your health care provider.  Haemophilus influenzae type b (Hib) vaccine.** / Consult your health care provider. Ages 64 years and over  Blood pressure check.** / Every year.  Lipid and cholesterol check.** / Every 5 years beginning at age 23 years.  Lung cancer screening. / Every year if you  are aged 16-80 years and have a 30-pack-year history of smoking and currently smoke or have quit within the past 15 years. Yearly screening is stopped once you have quit smoking for at least 15 years or develop a health problem that would prevent you from having lung cancer treatment.  Clinical breast exam.** / Every year after age 74 years.  BRCA-related cancer risk assessment.** / For women who have family members with a BRCA-related cancer (breast, ovarian, tubal, or peritoneal cancers).  Mammogram.** / Every year beginning at age 44 years and continuing for as long as you are in good health. Consult with your health care provider.  Pap test.** / Every 3 years starting at age 58 years through age 22 or 39 years with 3 consecutive normal Pap tests. Testing can be stopped between 65 and 70 years with 3 consecutive normal Pap tests and no abnormal Pap or HPV tests in the past 10 years.  HPV screening.** / Every 3 years from ages 64 years through ages 70 or 61 years with a history of 3 consecutive normal Pap tests. Testing can be stopped between 65 and 70 years with 3 consecutive normal Pap tests and no abnormal Pap or HPV tests in the past 10 years.  Fecal occult blood test (FOBT) of stool. / Every year beginning at age 40 years and continuing until age 27 years. You may not need to do this test if you get a colonoscopy every 10 years.  Flexible sigmoidoscopy or colonoscopy.** / Every 5 years for a flexible sigmoidoscopy or every 10 years for a colonoscopy beginning at age 7 years and continuing until age 32 years.  Hepatitis C blood test.** / For all people born from [birth year] through 1965 and any individual with known risks for hepatitis C.  Osteoporosis screening.** / A one-time screening for women ages 30 years and over and women at risk for fractures or osteoporosis.  Skin self-exam. / Monthly.  Influenza vaccine. / Every year.  Tetanus, diphtheria, and acellular pertussis (Tdap/Td)  vaccine.** / 1 dose of Td every 10 years.  Varicella vaccine.** / Consult your health care provider.  Zoster vaccine.** / 1 dose for adults aged 35 years or older.  Pneumococcal 13-valent conjugate (PCV13) vaccine.** / Consult your health care provider.  Pneumococcal polysaccharide (PPSV23) vaccine.** / 1 dose for all adults aged 46 years and older.  Meningococcal vaccine.** / Consult your health care provider.  Hepatitis A vaccine.** / Consult your health care provider.  Hepatitis B vaccine.** / Consult your health care provider.  Haemophilus influenzae type b (Hib) vaccine.** / Consult your health care provider. ** Family history and personal history of risk and conditions may change your health care provider's recommendations.   This information is not intended to replace advice given to you by your health care provider. Make sure you discuss any questions you have with your health care provider.   Document Released: 08/03/2001 Document Revised: 06/28/2014 Document Reviewed: 11/02/2010 Elsevier Interactive Patient Education [employer].

## 2015-11-18 NOTE — Progress Notes (Signed)
Subjective:     Amanda Wood is a 35 y.o. female and is here for a comprehensive physical exam. The patient reports no problems.  Social History   Social History  . Marital Status: Single    Spouse Name: N/A  . Number of Children: N/A  . Years of Education: N/A   Occupational History  . Not on file.   Social History Main Topics  . Smoking status: Never Smoker   . Smokeless tobacco: Not on file  . Alcohol Use: Yes     Comment: 1-2 glass of wine a week  . Drug Use: No  . Sexual Activity: Not on file   Other Topics Concern  . Not on file   Social History Narrative   Health Maintenance  Topic Date Due  . HIV Screening  09/06/1993  . TETANUS/TDAP  09/06/1997  . INFLUENZA VACCINE  01/20/2016  . PAP SMEAR  01/29/2017    The following portions of the patient's history were reviewed and updated as appropriate:  She  has a past medical history of Sciatica; Migraines; and Lactose intolerance. She  does not have any pertinent problems on file. She  has no past surgical history on file. Her family history includes Dementia in her maternal grandmother; Hypertension in her father; Stroke in her paternal grandmother. She  reports that she has never smoked. She does not have any smokeless tobacco history on file. She reports that she drinks alcohol. She reports that she does not use illicit drugs. She has a current medication list which includes the following prescription(s): drospirenone-ethinyl estradiol, multivitamin with minerals, and zolmitriptan. Current Outpatient Prescriptions on File Prior to Visit  Medication Sig Dispense Refill  . drospirenone-ethinyl estradiol (YAZ,GIANVI,LORYNA) 3-0.02 MG tablet TAKE 1 TABLET BY MOUTH DAILY. 84 tablet 0  . Multiple Vitamins-Minerals (MULTIVITAMIN WITH MINERALS) tablet Take 1 tablet by mouth daily.     No current facility-administered medications on file prior to visit.   She has No Known Allergies..  Review of Systems Review of  Systems  Constitutional: Negative for activity change, appetite change and fatigue.  HENT: Negative for hearing loss, congestion, tinnitus and ear discharge.  dentist [id number] Eyes: Negative for visual disturbance (see optho q1y -- vision corrected to 20/20 with glasses).  Respiratory: Negative for cough, chest tightness and shortness of breath.   Cardiovascular: Negative for chest pain, palpitations and leg swelling.  Gastrointestinal: Negative for abdominal pain, diarrhea, constipation and abdominal distention.  Genitourinary: Negative for urgency, frequency, decreased urine volume and difficulty urinating.  Musculoskeletal: Negative for back pain, arthralgias and gait problem.  Skin: Negative for color change, pallor and rash.  Neurological: Negative for dizziness, light-headedness, numbness and headaches.  Hematological: Negative for adenopathy. Does not bruise/bleed easily.  Psychiatric/Behavioral: Negative for suicidal ideas, confusion, sleep disturbance, self-injury, dysphoric mood, decreased concentration and agitation.      Objective:    BP 112/74 mmHg  Pulse 80  Temp(Src) 98.4 F (36.9 C) (Oral)  Ht 5\' 8"  (1.727 m)  Wt 158 lb 6.4 oz (71.85 kg)  BMI 24.09 kg/m2  SpO2 98%  LMP 11/11/2015 (Approximate) General appearance: alert, cooperative, appears stated age and no distress Head: Normocephalic, without obvious abnormality, atraumatic Eyes: conjunctivae/corneas clear. PERRL, EOM's intact. Fundi benign. Ears: normal TM's and external ear canals both ears Nose: Nares normal. Septum midline. Mucosa normal. No drainage or sinus tenderness. Throat: lips, mucosa, and tongue normal; teeth and gums normal Neck: no adenopathy, no carotid bruit, no JVD, supple, symmetrical, trachea midline  and thyroid not enlarged, symmetric, no tenderness/mass/nodules Back: symmetric, no curvature. ROM normal. No CVA tenderness. Lungs: clear to auscultation bilaterally Breasts: normal appearance, no  masses or tenderness Heart: regular rate and rhythm, S1, S2 normal, no murmur, click, rub or gallop Abdomen: soft, non-tender; bowel sounds normal; no masses,  no organomegaly Pelvic: deferred Extremities: extremities normal, atraumatic, no cyanosis or edema Pulses: 2+ and symmetric Skin: Skin color, texture, turgor normal. No rashes or lesions Lymph nodes: Cervical, supraclavicular, and axillary nodes normal. Neurologic: Alert and oriented X 3, normal strength and tone. Normal symmetric reflexes. Normal coordination and gait Psych-- no depression, no anxiety      Assessment:    Healthy female exam.     Plan:    ghm utd  Labs reviewed See After Visit Summary for Counseling Recommendations

## 2015-11-18 NOTE — Assessment & Plan Note (Signed)
Lasting longer00 3 days each month Try cont bcp for 3 months She will let us know if this does not work

## 2015-11-30 ENCOUNTER — Encounter: Payer: Self-pay | Admitting: Family Medicine

## 2015-12-01 ENCOUNTER — Other Ambulatory Visit: Payer: Self-pay | Admitting: Family Medicine

## 2015-12-01 DIAGNOSIS — J209 Acute bronchitis, unspecified: Secondary | ICD-10-CM

## 2015-12-01 MED ORDER — AZITHROMYCIN 250 MG PO TABS: ORAL_TABLET | ORAL | Status: DC

## 2015-12-01 MED FILL — AZITHROMYCIN 250 MG TABLET: 250 | 5 days supply | Qty: 6 | Fill #0

## 2015-12-01 NOTE — Telephone Encounter (Deleted)
Ok to send z pack in #1  As directed

## 2015-12-03 ENCOUNTER — Encounter: Payer: Self-pay | Admitting: Family Medicine

## 2015-12-10 MED FILL — ZOLMitriptan 5 MG TABS: 5 | 30 days supply | Qty: 9 | Fill #2

## 2016-02-02 ENCOUNTER — Other Ambulatory Visit: Payer: Self-pay | Admitting: Family Medicine

## 2016-02-02 MED FILL — DROSPIR-ETH ESTRA 3/.02 MG: 3-0.02 | 56 days supply | Qty: 56 | Fill #0

## 2016-02-03 ENCOUNTER — Other Ambulatory Visit: Payer: Self-pay

## 2016-02-03 DIAGNOSIS — G43829 Menstrual migraine, not intractable, without status migrainosus: Secondary | ICD-10-CM

## 2016-02-03 MED ORDER — ZOLMITRIPTAN 5 MG PO TABS: 5.0000 mg | ORAL_TABLET | ORAL | 5 refills | Status: DC | PRN

## 2016-02-03 MED FILL — ZOLMitriptan 5 MG TABS: 5 | 30 days supply | Qty: 9 | Fill #0

## 2016-02-12 DIAGNOSIS — M9901 Segmental and somatic dysfunction of cervical region: Secondary | ICD-10-CM | POA: Diagnosis not present

## 2016-02-12 DIAGNOSIS — M9903 Segmental and somatic dysfunction of lumbar region: Secondary | ICD-10-CM | POA: Diagnosis not present

## 2016-02-12 DIAGNOSIS — M545 Low back pain: Secondary | ICD-10-CM | POA: Diagnosis not present

## 2016-02-12 DIAGNOSIS — M25511 Pain in right shoulder: Secondary | ICD-10-CM | POA: Diagnosis not present

## 2016-03-26 DIAGNOSIS — H5213 Myopia, bilateral: Secondary | ICD-10-CM | POA: Diagnosis not present

## 2016-04-01 MED FILL — GIANVI 3-0.02 MG TABS: 3-0.02 | 84 days supply | Qty: 84 | Fill #1

## 2016-04-01 MED FILL — ZOLMitriptan 5 MG TABS: 5 | 30 days supply | Qty: 9 | Fill #1

## 2016-06-16 MED FILL — GIANVI 3-0.02 MG TABS: 3-0.02 | 84 days supply | Qty: 84 | Fill #2

## 2016-06-16 MED FILL — ZOLMitriptan 5 MG TABS: 5 | 30 days supply | Qty: 9 | Fill #2

## 2016-08-13 MED FILL — ZOLMitriptan 5 MG TABS: 5 | 30 days supply | Qty: 9 | Fill #3

## 2016-09-06 MED FILL — ZOLMitriptan 5 MG TABS: 5 | 30 days supply | Qty: 12 | Fill #4

## 2016-09-06 MED FILL — GIANVI 3-0.02 MG TABS: 3-0.02 | 84 days supply | Qty: 84 | Fill #3

## 2016-09-09 LAB — TSH: TSH: 3.68 (ref 0.41–5.90)

## 2016-09-09 LAB — HEPATIC FUNCTION PANEL
ALK PHOS: 47 (ref 25–125)
ALT: 7 (ref 7–35)
AST: 11 — AB (ref 13–35)
Bilirubin, Total: 0.5

## 2016-09-09 LAB — CBC AND DIFFERENTIAL
HEMATOCRIT: 40 (ref 36–46)
HEMOGLOBIN: 13.3 (ref 12.0–16.0)
Platelets: 259 (ref 150–399)
WBC: 5.5

## 2016-09-09 LAB — BASIC METABOLIC PANEL
BUN: 11 (ref 4–21)
Creatinine: 0.8 (ref 0.5–1.1)
GLUCOSE: 90
Potassium: 4.5 (ref 3.4–5.3)
SODIUM: 137 (ref 137–147)

## 2016-09-09 LAB — LIPID PANEL
CHOLESTEROL: 165 (ref 0–200)
HDL: 77 — AB (ref 35–70)
LDL Cholesterol: 68
Triglycerides: 101 (ref 40–160)

## 2016-11-17 MED FILL — ZOLMitriptan 5 MG TABS: 5 | 30 days supply | Qty: 12 | Fill #5

## 2016-11-26 ENCOUNTER — Encounter: Payer: Self-pay | Admitting: Family Medicine

## 2016-11-26 ENCOUNTER — Ambulatory Visit (INDEPENDENT_AMBULATORY_CARE_PROVIDER_SITE_OTHER): Payer: 59 | Admitting: Family Medicine

## 2016-11-26 VITALS — BP 119/71 | HR 99 | Temp 98.2°F | Ht 68.0 in | Wt 164.2 lb

## 2016-11-26 DIAGNOSIS — Z23 Encounter for immunization: Secondary | ICD-10-CM

## 2016-11-26 DIAGNOSIS — Z0000 Encounter for general adult medical examination without abnormal findings: Secondary | ICD-10-CM

## 2016-11-26 DIAGNOSIS — G43829 Menstrual migraine, not intractable, without status migrainosus: Secondary | ICD-10-CM

## 2016-11-26 MED ORDER — ZOLMITRIPTAN 5 MG PO TABS: 5.0000 mg | ORAL_TABLET | ORAL | 5 refills | Status: DC | PRN

## 2016-11-26 NOTE — Patient Instructions (Signed)
Preventive Care 18-39 Years, Female Preventive care refers to lifestyle choices and visits with your health care provider that can promote health and wellness. What does preventive care include?  A yearly physical exam. This is also called an annual well check.  Dental exams once or twice a year.  Routine eye exams. Ask your health care provider how often you should have your eyes checked.  Personal lifestyle choices, including: ? Daily care of your teeth and gums. ? Regular physical activity. ? Eating a healthy diet. ? Avoiding tobacco and drug use. ? Limiting alcohol use. ? Practicing safe sex. ? Taking vitamin and mineral supplements as recommended by your health care provider. What happens during an annual well check? The services and screenings done by your health care provider during your annual well check will depend on your age, overall health, lifestyle risk factors, and family history of disease. Counseling Your health care provider may ask you questions about your:  Alcohol use.  Tobacco use.  Drug use.  Emotional well-being.  Home and relationship well-being.  Sexual activity.  Eating habits.  Work and work Statistician.  Method of birth control.  Menstrual cycle.  Pregnancy history.  Screening You may have the following tests or measurements:  Height, weight, and BMI.  Diabetes screening. This is done by checking your blood sugar (glucose) after you have not eaten for a while (fasting).  Blood pressure.  Lipid and cholesterol levels. These may be checked every 5 years starting at age 66.  Skin check.  Hepatitis C blood test.  Hepatitis B blood test.  Sexually transmitted disease (STD) testing.  BRCA-related cancer screening. This may be done if you have a family history of breast, ovarian, tubal, or peritoneal cancers.  Pelvic exam and Pap test. This may be done every 3 years starting at age 40. Starting at age 59, this may be done every 5  years if you have a Pap test in combination with an HPV test.  Discuss your test results, treatment options, and if necessary, the need for more tests with your health care provider. Vaccines Your health care provider may recommend certain vaccines, such as:  Influenza vaccine. This is recommended every year.  Tetanus, diphtheria, and acellular pertussis (Tdap, Td) vaccine. You may need a Td booster every 10 years.  Varicella vaccine. You may need this if you have not been vaccinated.  HPV vaccine. If you are 69 or younger, you may need three doses over 6 months.  Measles, mumps, and rubella (MMR) vaccine. You may need at least one dose of MMR. You may also need a second dose.  Pneumococcal 13-valent conjugate (PCV13) vaccine. You may need this if you have certain conditions and were not previously vaccinated.  Pneumococcal polysaccharide (PPSV23) vaccine. You may need one or two doses if you smoke cigarettes or if you have certain conditions.  Meningococcal vaccine. One dose is recommended if you are age 27-21 years and a first-year college student living in a residence hall, or if you have one of several medical conditions. You may also need additional booster doses.  Hepatitis A vaccine. You may need this if you have certain conditions or if you travel or work in places where you may be exposed to hepatitis A.  Hepatitis B vaccine. You may need this if you have certain conditions or if you travel or work in places where you may be exposed to hepatitis B.  Haemophilus influenzae type b (Hib) vaccine. You may need this if  you have certain risk factors.  Talk to your health care provider about which screenings and vaccines you need and how often you need them. This information is not intended to replace advice given to you by your health care provider. Make sure you discuss any questions you have with your health care provider. Document Released: 08/03/2001 Document Revised: 02/25/2016  Document Reviewed: 04/08/2015 Elsevier Interactive Patient Education  2017 [employer].

## 2016-11-26 NOTE — Progress Notes (Signed)
Pre visit review using our clinic review tool, if applicable. No additional management support is needed unless otherwise documented below in the visit note. 

## 2016-11-26 NOTE — Progress Notes (Signed)
Patient ID: Amanda Wood, female   DOB: 1981-05-05, 36 y.o.   MRN: [id number]   Subjective:  I acted as a Education administrator for [employer], DO. Raiford Noble, [place]   Patient ID: Amanda Wood, female    DOB: 04/09/1981, 36 y.o.   MRN: [id number]  Chief Complaint  Patient presents with  . Annual Exam    HPI  Patient is in today for an annual examination. Patient has a Hx of menstrual migraines. Patient has no acute concerns noted at this time.  Patient Care Team: Carollee Herter, Alferd Apa, DO as PCP - General (Family Medicine) Katy Apo, MD as Consulting Physician (Ophthalmology)   Past Medical History:  Diagnosis Date  . Lactose intolerance   . Migraines   . Sciatica    left leg    History reviewed. No pertinent surgical history.  Family History  Problem Relation Age of Onset  . Hypertension Father   . Dementia Maternal Grandmother   . Stroke Paternal Grandmother     Social History   Social History  . Marital status: Single    Spouse name: N/A  . Number of children: N/A  . Years of education: N/A   Occupational History  . Not on file.   Social History Main Topics  . Smoking status: Never Smoker  . Smokeless tobacco: Never Used  . Alcohol use Yes     Comment: 1-2 glass of wine a week  . Drug use: No  . Sexual activity: Not on file   Other Topics Concern  . Not on file   Social History Narrative  . No narrative on file    Outpatient Medications Prior to Visit  Medication Sig Dispense Refill  . Multiple Vitamins-Minerals (MULTIVITAMIN WITH MINERALS) tablet Take 1 tablet by mouth daily.    Marland Kitchen zolmitriptan (ZOMIG) 5 MG tablet Take 1 tablet (5 mg total) by mouth as needed for migraine. 10 tablet 5  . azithromycin (ZITHROMAX Z-PAK) 250 MG tablet As directed (Patient not taking: Reported on 11/26/2016) 6 each 0  . drospirenone-ethinyl estradiol (YAZ,GIANVI,LORYNA) 3-0.02 MG tablet TAKE 1 TABLET BY MOUTH DAILY. (Patient not taking: Reported on 11/26/2016) 84 tablet 4    No facility-administered medications prior to visit.     No Known Allergies  Review of Systems  Constitutional: Negative for fever and malaise/fatigue.  HENT: Negative for congestion.   Eyes: Negative for blurred vision.  Respiratory: Negative for cough and shortness of breath.   Cardiovascular: Negative for chest pain, palpitations and leg swelling.  Gastrointestinal: Negative for vomiting.  Musculoskeletal: Negative for back pain.  Skin: Negative for rash.  Neurological: Negative for loss of consciousness and headaches.       Objective:    Physical Exam  Constitutional: She is oriented to person, place, and time. She appears well-developed and well-nourished. No distress.  HENT:  Head: Normocephalic and atraumatic.  Eyes: Conjunctivae are normal.  Neck: Normal range of motion. No thyromegaly present.  Cardiovascular: Normal rate and regular rhythm.   Pulmonary/Chest: Effort normal and breath sounds normal. She has no wheezes.  Abdominal: Soft. Bowel sounds are normal. There is no tenderness.  Musculoskeletal: She exhibits no edema or deformity.  Neurological: She is alert and oriented to person, place, and time.  Skin: Skin is warm and dry. She is not diaphoretic.  Psychiatric: She has a normal mood and affect.    BP 119/71 (BP Location: Left Arm, Patient Position: Sitting, Cuff Size: Normal)   Pulse 99  Temp 98.2 F (36.8 C) (Oral)   Ht '5\' 8"'$  (1.727 m)   Wt 164 lb 3.2 oz (74.5 kg)   SpO2 98% Comment: RA  BMI 24.97 kg/m  Wt Readings from Last 3 Encounters:  11/26/16 164 lb 3.2 oz (74.5 kg)  11/18/15 158 lb 6.4 oz (71.8 kg)  11/08/14 158 lb (71.7 kg)   BP Readings from Last 3 Encounters:  11/26/16 119/71  11/18/15 112/74  11/08/14 136/89     Immunization History  Administered Date(s) Administered  . Influenza,inj,Quad PF,36+ Mos 02/19/2016  . Influenza-Unspecified 02/19/2013, 01/19/2014, 03/14/2015  . Tdap 11/26/2016    Health Maintenance  Topic  Date Due  . TETANUS/TDAP  02/26/2017 (Originally 09/06/1997)  . HIV Screening  11/26/2017 (Originally 09/06/1993)  . INFLUENZA VACCINE  01/19/2017  . PAP SMEAR  01/29/2017    No results found for: WBC, HGB, HCT, PLT, GLUCOSE, CHOL, TRIG, HDL, LDLDIRECT, LDLCALC, ALT, AST, NA, K, CL, CREATININE, BUN, CO2, TSH, PSA, INR, GLUF, HGBA1C, MICROALBUR  No results found for: TSH No results found for: WBC, HGB, HCT, MCV, PLT No results found for: NA, K, CHLORIDE, CO2, GLUCOSE, BUN, CREATININE, BILITOT, ALKPHOS, AST, ALT, PROT, ALBUMIN, CALCIUM, ANIONGAP, EGFR, GFR No results found for: CHOL No results found for: HDL No results found for: LDLCALC No results found for: TRIG No results found for: CHOLHDL No results found for: HGBA1C       Assessment & Plan:   Problem List Items Addressed This Visit      Unprioritized   Menstrual migraine   Relevant Medications   zolmitriptan (ZOMIG) 5 MG tablet   Preventative health care - Primary    ghm utd Check labs See AVS Update tdap      Relevant Orders   Tdap vaccine greater than or equal to 7yo IM (Completed)    Other Visit Diagnoses    Need for diphtheria-tetanus-pertussis (Tdap) vaccine       Relevant Orders   Tdap vaccine greater than or equal to 7yo IM (Completed)      I have discontinued Ms. Austell's azithromycin and drospirenone-ethinyl estradiol. I am also having her maintain her multivitamin with minerals and zolmitriptan.  Meds ordered this encounter  Medications  . zolmitriptan (ZOMIG) 5 MG tablet    Sig: Take 1 tablet (5 mg total) by mouth as needed for migraine.    Dispense:  10 tablet    Refill:  5    CMA served as scribe during this visit. History, Physical and Plan performed by medical provider. Documentation and orders reviewed and attested to.  Ann Held, DO

## 2016-11-26 NOTE — Assessment & Plan Note (Signed)
ghm utd Check labs See AVS Update tdap

## 2016-11-29 ENCOUNTER — Encounter: Payer: Self-pay | Admitting: Family Medicine

## 2017-02-07 MED FILL — ZOLMitriptan 5 MG TABS: 5 | 28 days supply | Qty: 11 | Fill #0

## 2017-04-01 DIAGNOSIS — H5213 Myopia, bilateral: Secondary | ICD-10-CM | POA: Diagnosis not present

## 2017-06-16 MED FILL — ZOLMitriptan 5 MG TABS: 5 | 28 days supply | Qty: 11 | Fill #1

## 2017-08-15 MED FILL — ZOLMitriptan 5 MG TABS: 5 | 30 days supply | Qty: 8 | Fill #2

## 2017-09-15 ENCOUNTER — Other Ambulatory Visit: Payer: Self-pay | Admitting: Occupational Medicine

## 2017-09-15 LAB — CBC WITH DIFFERENTIAL/PLATELET
Basophils Absolute: 40 cells/uL (ref 0–200)
Basophils Relative: 0.9 %
EOS PCT: 3.4 %
Eosinophils Absolute: 150 cells/uL (ref 15–500)
HEMATOCRIT: 39.9 % (ref 35.0–45.0)
HEMOGLOBIN: 13.6 g/dL (ref 11.7–15.5)
LYMPHS ABS: 1342 {cells}/uL (ref 850–3900)
MCH: 30.2 pg (ref 27.0–33.0)
MCHC: 34.1 g/dL (ref 32.0–36.0)
MCV: 88.7 fL (ref 80.0–100.0)
MPV: 11.7 fL (ref 7.5–12.5)
Monocytes Relative: 6.6 %
NEUTROS ABS: 2578 {cells}/uL (ref 1500–7800)
NEUTROS PCT: 58.6 %
Platelets: 233 10*3/uL (ref 140–400)
RBC: 4.5 10*6/uL (ref 3.80–5.10)
RDW: 11.8 % (ref 11.0–15.0)
Total Lymphocyte: 30.5 %
WBC mixed population: 290 cells/uL (ref 200–950)
WBC: 4.4 10*3/uL (ref 3.8–10.8)

## 2017-09-15 LAB — COMPLETE METABOLIC PANEL WITH GFR
AG Ratio: 2 (calc) (ref 1.0–2.5)
ALKALINE PHOSPHATASE (APISO): 55 U/L (ref 33–115)
ALT: 12 U/L (ref 6–29)
AST: 16 U/L (ref 10–30)
Albumin: 4.5 g/dL (ref 3.6–5.1)
BUN: 14 mg/dL (ref 7–25)
CO2: 26 mmol/L (ref 20–32)
CREATININE: 0.85 mg/dL (ref 0.50–1.10)
Calcium: 9.6 mg/dL (ref 8.6–10.2)
Chloride: 106 mmol/L (ref 98–110)
GFR, Est African American: 100 mL/min/{1.73_m2} (ref >=60)
GFR, Est Non African American: 86 mL/min/{1.73_m2} (ref >=60)
GLUCOSE: 92 mg/dL (ref 65–99)
Globulin: 2.3 g/dL (calc) (ref 1.9–3.7)
Potassium: 4.8 mmol/L (ref 3.5–5.3)
Sodium: 139 mmol/L (ref 135–146)
Total Bilirubin: 0.7 mg/dL (ref 0.2–1.2)
Total Protein: 6.8 g/dL (ref 6.1–8.1)

## 2017-09-15 LAB — LIPID PANEL
CHOL/HDL RATIO: 2.4 (calc) (ref <5.0)
Cholesterol: 152 mg/dL (ref <200)
HDL: 63 mg/dL (ref >50)
LDL CHOLESTEROL (CALC): 76 mg/dL
NON-HDL CHOLESTEROL (CALC): 89 mg/dL (ref <130)
Triglycerides: 54 mg/dL (ref <150)

## 2017-09-15 LAB — TSH: TSH: 2.92 m[IU]/L

## 2017-09-15 LAB — TIQ-MISC

## 2017-11-24 MED FILL — ZOLMitriptan 5 MG TABS: 5 | 30 days supply | Qty: 8 | Fill #3

## 2017-12-02 ENCOUNTER — Ambulatory Visit (INDEPENDENT_AMBULATORY_CARE_PROVIDER_SITE_OTHER): Payer: No Typology Code available for payment source | Admitting: Family Medicine

## 2017-12-02 ENCOUNTER — Encounter: Payer: Self-pay | Admitting: Family Medicine

## 2017-12-02 ENCOUNTER — Other Ambulatory Visit (HOSPITAL_COMMUNITY)
Admission: RE | Admit: 2017-12-02 | Discharge: 2017-12-02 | Disposition: A | Discharge status: Home / Self Care | Payer: No Typology Code available for payment source | Source: Ambulatory Visit | Attending: Family Medicine | Admitting: Family Medicine

## 2017-12-02 VITALS — BP 123/73 | HR 83 | Temp 98.2°F | Resp 18 | Ht 66.5 in | Wt 162.0 lb

## 2017-12-02 DIAGNOSIS — Z0000 Encounter for general adult medical examination without abnormal findings: Secondary | ICD-10-CM | POA: Diagnosis not present

## 2017-12-02 DIAGNOSIS — H538 Other visual disturbances: Secondary | ICD-10-CM

## 2017-12-02 DIAGNOSIS — G43809 Other migraine, not intractable, without status migrainosus: Secondary | ICD-10-CM | POA: Diagnosis not present

## 2017-12-02 DIAGNOSIS — Z01419 Encounter for gynecological examination (general) (routine) without abnormal findings: Secondary | ICD-10-CM

## 2017-12-02 MED ORDER — SUMATRIPTAN SUCCINATE 6 MG/0.5ML ~~LOC~~ SOLN: 6.0000 mg | SUBCUTANEOUS | 5 refills | Status: DC | PRN

## 2017-12-02 NOTE — Patient Instructions (Signed)
Preventive Care 18-39 Years, Female Preventive care refers to lifestyle choices and visits with your health care provider that can promote health and wellness. What does preventive care include?  A yearly physical exam. This is also called an annual well check.  Dental exams once or twice a year.  Routine eye exams. Ask your health care provider how often you should have your eyes checked.  Personal lifestyle choices, including: ? Daily care of your teeth and gums. ? Regular physical activity. ? Eating a healthy diet. ? Avoiding tobacco and drug use. ? Limiting alcohol use. ? Practicing safe sex. ? Taking vitamin and mineral supplements as recommended by your health care provider. What happens during an annual well check? The services and screenings done by your health care provider during your annual well check will depend on your age, overall health, lifestyle risk factors, and family history of disease. Counseling Your health care provider may ask you questions about your:  Alcohol use.  Tobacco use.  Drug use.  Emotional well-being.  Home and relationship well-being.  Sexual activity.  Eating habits.  Work and work Statistician.  Method of birth control.  Menstrual cycle.  Pregnancy history.  Screening You may have the following tests or measurements:  Height, weight, and BMI.  Diabetes screening. This is done by checking your blood sugar (glucose) after you have not eaten for a while (fasting).  Blood pressure.  Lipid and cholesterol levels. These may be checked every 5 years starting at age 66.  Skin check.  Hepatitis C blood test.  Hepatitis B blood test.  Sexually transmitted disease (STD) testing.  BRCA-related cancer screening. This may be done if you have a family history of breast, ovarian, tubal, or peritoneal cancers.  Pelvic exam and Pap test. This may be done every 3 years starting at age 40. Starting at age 59, this may be done every 5  years if you have a Pap test in combination with an HPV test.  Discuss your test results, treatment options, and if necessary, the need for more tests with your health care provider. Vaccines Your health care provider may recommend certain vaccines, such as:  Influenza vaccine. This is recommended every year.  Tetanus, diphtheria, and acellular pertussis (Tdap, Td) vaccine. You may need a Td booster every 10 years.  Varicella vaccine. You may need this if you have not been vaccinated.  HPV vaccine. If you are 69 or younger, you may need three doses over 6 months.  Measles, mumps, and rubella (MMR) vaccine. You may need at least one dose of MMR. You may also need a second dose.  Pneumococcal 13-valent conjugate (PCV13) vaccine. You may need this if you have certain conditions and were not previously vaccinated.  Pneumococcal polysaccharide (PPSV23) vaccine. You may need one or two doses if you smoke cigarettes or if you have certain conditions.  Meningococcal vaccine. One dose is recommended if you are age 27-21 years and a first-year college student living in a residence hall, or if you have one of several medical conditions. You may also need additional booster doses.  Hepatitis A vaccine. You may need this if you have certain conditions or if you travel or work in places where you may be exposed to hepatitis A.  Hepatitis B vaccine. You may need this if you have certain conditions or if you travel or work in places where you may be exposed to hepatitis B.  Haemophilus influenzae type b (Hib) vaccine. You may need this if  you have certain risk factors.  Talk to your health care provider about which screenings and vaccines you need and how often you need them. This information is not intended to replace advice given to you by your health care provider. Make sure you discuss any questions you have with your health care provider. Document Released: 08/03/2001 Document Revised: 02/25/2016  Document Reviewed: 04/08/2015 Elsevier Interactive Patient Education  [employer].

## 2017-12-02 NOTE — Progress Notes (Signed)
Subjective:  I acted as a Neurosurgeon for [employer]. Fuller Song, RMA   Patient ID: Amanda [facility], female    DOB: September 13, 1980, 37 y.o.   MRN: [id number]  Chief Complaint  Patient presents with  . Annual Exam    HPI  Patient is in today for annual exam. And pap.   Her migraines are worsening-- she is studying for boards.   She sometimes needs 2 zomig.  No other complaints.   Patient Care Team: Zola Button, Grayling Congress, DO as PCP - General (Family Medicine) Antony Contras, MD as Consulting Physician (Ophthalmology)   Past Medical History:  Diagnosis Date  . Lactose intolerance   . Migraines   . Sciatica    left leg    History reviewed. No pertinent surgical history.  Family History  Problem Relation Age of Onset  . Hypertension Father   . Dementia Maternal Grandmother   . Stroke Paternal Grandmother     Social History   Socioeconomic History  . Marital status: Single    Spouse name: Not on file  . Number of children: Not on file  . Years of education: Not on file  . Highest education level: Not on file  Occupational History  . Not on file  Social Needs  . Financial resource strain: Not on file  . Food insecurity:    Worry: Not on file    Inability: Not on file  . Transportation needs:    Medical: Not on file    Non-medical: Not on file  Tobacco Use  . Smoking status: Never Smoker  . Smokeless tobacco: Never Used  Substance and Sexual Activity  . Alcohol use: Yes    Comment: 1-2 glass of wine a week  . Drug use: No  . Sexual activity: Not on file  Lifestyle  . Physical activity:    Days per week: Not on file    Minutes per session: Not on file  . Stress: Not on file  Relationships  . Social connections:    Talks on phone: Not on file    Gets together: Not on file    Attends religious service: Not on file    Active member of club or organization: Not on file    Attends meetings of clubs or organizations: Not on file    Relationship status: Not on file  .  Intimate partner violence:    Fear of current or ex partner: Not on file    Emotionally abused: Not on file    Physically abused: Not on file    Forced sexual activity: Not on file  Other Topics Concern  . Not on file  Social History Narrative  . Not on file    Outpatient Medications Prior to Visit  Medication Sig Dispense Refill  . Multiple Vitamins-Minerals (MULTIVITAMIN WITH MINERALS) tablet Take 1 tablet by mouth daily.    Marland Kitchen zolmitriptan (ZOMIG) 5 MG tablet Take 1 tablet (5 mg total) by mouth as needed for migraine. 10 tablet 5   No facility-administered medications prior to visit.     No Known Allergies  Review of Systems  Constitutional: Negative for chills, fever and malaise/fatigue.  HENT: Negative for congestion and hearing loss.   Eyes: Negative for blurred vision and discharge.  Respiratory: Negative for cough, sputum production and shortness of breath.   Cardiovascular: Negative for chest pain, palpitations and leg swelling.  Gastrointestinal: Negative for abdominal pain, blood in stool, constipation, diarrhea, heartburn, nausea and vomiting.  Genitourinary: Negative for  dysuria, frequency, hematuria and urgency.  Musculoskeletal: Negative for back pain, falls and myalgias.  Skin: Negative for rash.  Neurological: Negative for dizziness, sensory change, loss of consciousness, weakness and headaches.  Endo/Heme/Allergies: Negative for environmental allergies. Does not bruise/bleed easily.  Psychiatric/Behavioral: Negative for depression and suicidal ideas. The patient is not nervous/anxious and does not have insomnia.        Objective:    Physical Exam  Constitutional: She is oriented to person, place, and time. She appears well-developed and well-nourished. No distress.  HENT:  Head: Normocephalic and atraumatic.  Right Ear: External ear normal.  Left Ear: External ear normal.  Nose: Nose normal.  Mouth/Throat: Oropharynx is clear and moist.  Eyes: Pupils  are equal, round, and reactive to light. Conjunctivae and EOM are normal. Right eye exhibits no discharge. Left eye exhibits no discharge.  Neck: Normal range of motion. Neck supple. No JVD present. No thyromegaly present.  Cardiovascular: Normal rate, regular rhythm, normal heart sounds and intact distal pulses.  No murmur heard. Pulmonary/Chest: Effort normal and breath sounds normal. No respiratory distress. She has no wheezes. She has no rales. She exhibits no tenderness. Right breast exhibits no inverted nipple, no mass, no nipple discharge, no skin change and no tenderness. Left breast exhibits no inverted nipple, no mass, no nipple discharge, no skin change and no tenderness. No breast swelling, tenderness, discharge or bleeding. Breasts are symmetrical.  Abdominal: Soft. Bowel sounds are normal. She exhibits no distension and no mass. There is no tenderness. There is no rebound and no guarding.  Genitourinary: Vagina normal and uterus normal. Rectal exam shows guaiac negative stool. No vaginal discharge found.  Genitourinary Comments: Pap done  Musculoskeletal: Normal range of motion. She exhibits no edema or tenderness.  Lymphadenopathy:    She has no cervical adenopathy.  Neurological: She is alert and oriented to person, place, and time. She has normal reflexes. No cranial nerve deficit.  Skin: Skin is warm and dry. No rash noted. She is not diaphoretic. No erythema.  Psychiatric: She has a normal mood and affect. Her behavior is normal. Judgment and thought content normal.  Nursing note and vitals reviewed.   BP 123/73 (BP Location: Left Arm, Patient Position: Sitting, Cuff Size: Normal)   Pulse 83   Temp 98.2 F (36.8 C) (Oral)   Resp 18   Ht 5' 6.5" (1.689 m)   Wt 162 lb (73.5 kg)   SpO2 100%   BMI 25.76 kg/m  Wt Readings from Last 3 Encounters:  12/02/17 162 lb (73.5 kg)  11/26/16 164 lb 3.2 oz (74.5 kg)  11/18/15 158 lb 6.4 oz (71.8 kg)   BP Readings from Last 3  Encounters:  12/02/17 123/73  11/26/16 119/71  11/18/15 112/74     Immunization History  Administered Date(s) Administered  . Influenza,inj,Quad PF,6+ Mos 02/19/2016  . Influenza-Unspecified 02/19/2013, 01/19/2014, 03/14/2015  . Tdap 11/26/2016    Health Maintenance  Topic Date Due  . HIV Screening  09/06/1993  . PAP SMEAR  01/29/2017  . INFLUENZA VACCINE  01/19/2018  . TETANUS/TDAP  11/27/2026    Lab Results  Component Value Date   WBC 4.4 09/15/2017   HGB 13.6 09/15/2017   HCT 39.9 09/15/2017   PLT 233 09/15/2017   GLUCOSE 92 09/15/2017   CHOL 152 09/15/2017   TRIG 54 09/15/2017   HDL 63 09/15/2017   LDLCALC 76 09/15/2017   ALT 12 09/15/2017   AST 16 09/15/2017   NA 139 09/15/2017  K 4.8 09/15/2017   CL 106 09/15/2017   CREATININE 0.85 09/15/2017   BUN 14 09/15/2017   CO2 26 09/15/2017   TSH 2.92 09/15/2017    Lab Results  Component Value Date   TSH 2.92 09/15/2017   Lab Results  Component Value Date   WBC 4.4 09/15/2017   HGB 13.6 09/15/2017   HCT 39.9 09/15/2017   MCV 88.7 09/15/2017   PLT 233 09/15/2017   Lab Results  Component Value Date   NA 139 09/15/2017   K 4.8 09/15/2017   CO2 26 09/15/2017   GLUCOSE 92 09/15/2017   BUN 14 09/15/2017   CREATININE 0.85 09/15/2017   BILITOT 0.7 09/15/2017   ALKPHOS 47 09/09/2016   AST 16 09/15/2017   ALT 12 09/15/2017   PROT 6.8 09/15/2017   CALCIUM 9.6 09/15/2017   Lab Results  Component Value Date   CHOL 152 09/15/2017   Lab Results  Component Value Date   HDL 63 09/15/2017   Lab Results  Component Value Date   LDLCALC 76 09/15/2017   Lab Results  Component Value Date   TRIG 54 09/15/2017   Lab Results  Component Value Date   CHOLHDL 2.4 09/15/2017   No results found for: HGBA1C       Assessment & Plan:   Problem List Items Addressed This Visit      Unprioritized   Preventative health care    ghm utd Labs reviewed See AVS Pt would like to start HPV vaccine          Other Visit Diagnoses    Encounter for gynecological examination without abnormal finding    -  Primary   Relevant Orders   Cytology - PAP   Blurry vision       Relevant Orders   Ambulatory referral to Ophthalmology   Other migraine without status migrainosus, not intractable       Relevant Medications   SUMAtriptan (IMITREX) 6 MG/0.5ML SOLN injection      I am having Amanda Wood start on SUMAtriptan. I am also having her maintain her multivitamin with minerals and zolmitriptan.  Meds ordered this encounter  Medications  . SUMAtriptan (IMITREX) 6 MG/0.5ML SOLN injection    Sig: Inject 0.5 mLs (6 mg total) into the skin every 2 (two) hours as needed for migraine or headache. May repeat in 2 hours if headache persists or recurs.    Dispense:  0.5 mL    Refill:  5    CMA served as scribe during this visit. History, Physical and Plan performed by medical provider. Documentation and orders reviewed and attested to.  Donato Schultz, DO

## 2017-12-02 NOTE — Assessment & Plan Note (Signed)
ghm utd Labs reviewed See AVS Pt would like to start HPV vaccine

## 2017-12-06 LAB — CYTOLOGY - PAP
DIAGNOSIS: NEGATIVE
HPV (WINDOPATH): NOT DETECTED

## 2018-01-04 ENCOUNTER — Other Ambulatory Visit: Payer: Self-pay | Admitting: Family Medicine

## 2018-01-04 DIAGNOSIS — G43829 Menstrual migraine, not intractable, without status migrainosus: Secondary | ICD-10-CM

## 2018-01-05 MED FILL — ZOLMitriptan 5 MG TABS: 5 | 30 days supply | Qty: 10 | Fill #0

## 2018-01-05 NOTE — Telephone Encounter (Signed)
Refill request for zolmitriptan (ZOMIG) 5MG  tablet.  Last office visit: 12/02/2017 Last refill: 11/26/2016  Refill sent to pt's pharmacy.

## 2018-03-20 MED FILL — ZOLMitriptan 5 MG TABS: 5 | 30 days supply | Qty: 10 | Fill #1

## 2018-05-11 MED FILL — ZOLMitriptan 5 MG TABS: 5 | 30 days supply | Qty: 10 | Fill #2

## 2018-06-30 MED FILL — ZOLMitriptan 5 MG TABS: 5 | 30 days supply | Qty: 10 | Fill #3

## 2018-08-02 MED FILL — ZOLMitriptan 5 MG TABS: 5 | 30 days supply | Qty: 10 | Fill #4

## 2018-08-03 ENCOUNTER — Other Ambulatory Visit: Payer: Self-pay | Admitting: Family Medicine

## 2018-08-03 ENCOUNTER — Encounter: Payer: Self-pay | Admitting: Family Medicine

## 2018-08-03 DIAGNOSIS — M25569 Pain in unspecified knee: Secondary | ICD-10-CM

## 2018-08-04 ENCOUNTER — Telehealth: Payer: Self-pay | Admitting: Family Medicine

## 2018-08-04 NOTE — Telephone Encounter (Signed)
Dr. Renato Gails may need to be worked in to see [employer].  Referral has been sent over by [facility].  I have given Dr. Renato Gails Lindsay's phone number to contact as well.

## 2018-08-07 NOTE — Progress Notes (Signed)
[facility] [address] [place], [place] [address] Phone: [phone] Subjective:   Amanda Wood, am serving as a scribe for Dr. Antoine Primas.   CC: Left knee pain  [id number]  Amanda Wood is a 38 y.o. female coming in with complaint of left knee pain. Patient states that she is having medial knee pain. Has been training for a 1/2 marathon. Ran 10 miles and noticed that evening the stair ascending and descending increased her pain. Watched [employer] the next evening and was sitting for prolonged period which caused it to stiffen. Is using IBU and Tylenol. Has tried walking a longer distance and this increased her pain recently has not tried running again.       Past Medical History:  Diagnosis Date  . Lactose intolerance   . Migraines   . Sciatica    left leg   No past surgical history on file. Social History   Socioeconomic History  . Marital status: Single    Spouse name: Not on file  . Number of children: Not on file  . Years of education: Not on file  . Highest education level: Not on file  Occupational History  . Not on file  Social Needs  . Financial resource strain: Not on file  . Food insecurity:    Worry: Not on file    Inability: Not on file  . Transportation needs:    Medical: Not on file    Non-medical: Not on file  Tobacco Use  . Smoking status: Never Smoker  . Smokeless tobacco: Never Used  Substance and Sexual Activity  . Alcohol use: Yes    Comment: 1-2 glass of wine a week  . Drug use: No  . Sexual activity: Not on file  Lifestyle  . Physical activity:    Days per week: Not on file    Minutes per session: Not on file  . Stress: Not on file  Relationships  . Social connections:    Talks on phone: Not on file    Gets together: Not on file    Attends religious service: Not on file    Active member of club or organization: Not on file    Attends meetings of clubs or organizations: Not on file   Relationship status: Not on file  Other Topics Concern  . Not on file  Social History Narrative  . Not on file   No Known Allergies Family History  Problem Relation Age of Onset  . Hypertension Father   . Dementia Maternal Grandmother   . Stroke Paternal Grandmother        Current Outpatient Medications (Analgesics):  Marland Kitchen  SUMAtriptan (IMITREX) 6 MG/0.5ML SOLN injection, Inject 0.5 mLs (6 mg total) into the skin every 2 (two) hours as needed for migraine or headache. May repeat in 2 hours if headache persists or recurs. Marland Kitchen  zolmitriptan (ZOMIG) 5 MG tablet, TAKE 1 TABLET BY MOUTH AS NEEDED FOR MIGRAINE.   Current Outpatient Medications (Other):  Marland Kitchen  Multiple Vitamins-Minerals (MULTIVITAMIN WITH MINERALS) tablet, Take 1 tablet by mouth daily. .  Vitamin D, Ergocalciferol, (DRISDOL) 1.25 MG (50000 UT) CAPS capsule, Take 1 capsule (50,000 Units total) by mouth every 7 (seven) days.    Past medical history, social, surgical and family history all reviewed in electronic medical record.  No pertanent information unless stated regarding to the chief complaint.   Review of Systems:  No headache, visual changes, nausea, vomiting, diarrhea, constipation,  dizziness, abdominal pain, skin rash, fevers, chills, night sweats, weight loss, swollen lymph nodes, body aches, joint swelling,  chest pain, shortness of breath, mood changes.  Positive muscle aches  Objective  Blood pressure 118/78, pulse 85, height 5' 6.5" (1.689 m), weight 162 lb (73.5 kg), SpO2 98 %.    General: No apparent distress alert and oriented x3 mood and affect normal, dressed appropriately.  HEENT: Pupils equal, extraocular movements intact  Respiratory: Patient's speak in full sentences and does not appear short of breath  Cardiovascular: No lower extremity edema, non tender, no erythema  Skin: Warm dry intact with no signs of infection or rash on extremities or on axial skeleton.  Abdomen: Soft nontender  Neuro: Cranial  nerves II through XII are intact, neurovascularly intact in all extremities with 2+ DTRs and 2+ pulses.  Lymph: No lymphadenopathy of posterior or anterior cervical chain or axillae bilaterally.  Gait normal with good balance and coordination.  MSK:  tender with full range of motion and good stability and symmetric strength and tone of shoulders, elbows, wrist, hip and ankles bilaterally.  Knee: Left Normal to inspection with no erythema or effusion or obvious bony abnormalities. Tender over the medial compartment ROM full in flexion and extension and lower leg rotation. Ligaments with solid consistent endpoints including ACL, PCL, LCL, MCL. Negative Mcmurray's, Apley's, and Thessalonian tests. painful patellar compression. Patellar glide moderate crepitus. Patellar and quadriceps tendons unremarkable. Hamstring and quadriceps strength is normal. Contralateral knee unremarkable  MSK US performed of: Left knee This study was ordered, performed, and interpreted by Terrilee Files D.O.  Knee: Medial compartment of the knee shows the patient does have some what appears to be swelling over the distal aspect of the femur.  Patient also has some very small abnormality noted of the tibial plateau.  Patient's patella has very mild arthritic changes and does have a trace effusion noted.  Meniscus seem to be intact and relatively unremarkable.   IMPRESSION: Possible stress reaction to the medial compartment of the knee    Impression and Recommendations:     This case required medical decision making of moderate complexity. The above documentation has been reviewed and is accurate and complete Amanda Saa, DO       Note: This dictation was prepared with Dragon dictation along with smaller phrase technology. Any transcriptional errors that result from this process are unintentional.

## 2018-08-07 NOTE — Telephone Encounter (Signed)
Spoke with pt, scheduled her tomorrow @ 1pm.

## 2018-08-08 ENCOUNTER — Ambulatory Visit: Payer: No Typology Code available for payment source | Admitting: Family Medicine

## 2018-08-08 ENCOUNTER — Ambulatory Visit: Payer: Self-pay

## 2018-08-08 ENCOUNTER — Encounter: Payer: Self-pay | Admitting: Family Medicine

## 2018-08-08 VITALS — BP 118/78 | HR 85 | Ht 66.5 in | Wt 162.0 lb

## 2018-08-08 DIAGNOSIS — G8929 Other chronic pain: Secondary | ICD-10-CM

## 2018-08-08 DIAGNOSIS — M25562 Pain in left knee: Principal | ICD-10-CM

## 2018-08-08 MED ORDER — VITAMIN D (ERGOCALCIFEROL) 1.25 MG (50000 UNIT) PO CAPS
50000.0000 [IU] | ORAL_CAPSULE | ORAL | 0 refills | Status: DC
Start: 2018-08-08 — End: 2019-12-21

## 2018-08-08 NOTE — Patient Instructions (Signed)
Good to see you.  Ice 20 minutes 2 times daily. Usually after activity and before bed. pennsaid pinkie amount topically 2 times daily as needed.  Once weekly vitamin D for 12 weeks to help the bone K2 over the counter daily for 1 month Try to do the zero gravity trainer a little more frequently.  Ok to do only the long run  On the road for now.  [employer] "total support" online would be great  I am optimistic but we will need to watch  See me again in 3-4 weeks

## 2018-08-08 NOTE — Assessment & Plan Note (Signed)
Medial knee pain.  Discussed icing regimen and home exercises.  Possible injury to the femoral condyle as well as the tibial plateau.  Once weekly vitamin D, compression, home exercises working on the hamstring tightening.  Lower impact exercises.  Follow-up again 4 weeks

## 2018-08-17 MED FILL — VIT D2 1.25 MG (50,000 UNIT: 1.25 MG | 84 days supply | Qty: 12 | Fill #0

## 2018-08-31 NOTE — Progress Notes (Signed)
[name] [address] [place], [place] [address] Phone: [phone] Subjective:   I [name] am serving as a Neurosurgeon for Dr. Antoine Primas.   CC: Knee pain follow-up  [id number]     09/01/2018 Amanda Wood is a 38 y.o. female coming in with complaint of left knee pain. Past week the pain is getting better. Elliptical made her pain worse. Has discontinued all activity. Doing yoga and PT. Patient was found to have possible injury to the femoral condyle as well as the tibial plateau.  Possible stress reaction.  Feels like she is 85% better.  Has not been running low.  Wants to start doing that and wants to make sure that it is safe.     Past Medical History:  Diagnosis Date  . Lactose intolerance   . Migraines   . Sciatica    left leg   No past surgical history on file. Social History   Socioeconomic History  . Marital status: Single    Spouse name: Not on file  . Number of children: Not on file  . Years of education: Not on file  . Highest education level: Not on file  Occupational History  . Not on file  Social Needs  . Financial resource strain: Not on file  . Food insecurity:    Worry: Not on file    Inability: Not on file  . Transportation needs:    Medical: Not on file    Non-medical: Not on file  Tobacco Use  . Smoking status: Never Smoker  . Smokeless tobacco: Never Used  Substance and Sexual Activity  . Alcohol use: Yes    Comment: 1-2 glass of wine a week  . Drug use: No  . Sexual activity: Not on file  Lifestyle  . Physical activity:    Days per week: Not on file    Minutes per session: Not on file  . Stress: Not on file  Relationships  . Social connections:    Talks on phone: Not on file    Gets together: Not on file    Attends religious service: Not on file    Active member of club or organization: Not on file    Attends meetings of clubs or organizations: Not on file    Relationship status: Not on  file  Other Topics Concern  . Not on file  Social History Narrative  . Not on file   No Known Allergies Family History  Problem Relation Age of Onset  . Hypertension Father   . Dementia Maternal Grandmother   . Stroke Paternal Grandmother        Current Outpatient Medications (Analgesics):  .  zolmitriptan (ZOMIG) 5 MG tablet, TAKE 1 TABLET BY MOUTH AS NEEDED FOR MIGRAINE.   Current Outpatient Medications (Other):  Marland Kitchen  Multiple Vitamins-Minerals (MULTIVITAMIN WITH MINERALS) tablet, Take 1 tablet by mouth daily. .  Vitamin D, Ergocalciferol, (DRISDOL) 1.25 MG (50000 UT) CAPS capsule, Take 1 capsule (50,000 Units total) by mouth every 7 (seven) days.    Past medical history, social, surgical and family history all reviewed in electronic medical record.  No pertanent information unless stated regarding to the chief complaint.   Review of Systems:  No headache, visual changes, nausea, vomiting, diarrhea, constipation, dizziness, abdominal pain, skin rash, fevers, chills, night sweats, weight loss, swollen lymph nodes, body aches, joint swelling, chest pain, shortness of breath, mood changes.  Positive muscle aches  Objective  Blood pressure  130/90, pulse 89, height 5\' 6"  (1.676 m), weight 160 lb (72.6 kg), SpO2 98 %.    General: No apparent distress alert and oriented x3 mood and affect normal, dressed appropriately.  HEENT: Pupils equal, extraocular movements intact  Respiratory: Patient's speak in full sentences and does not appear short of breath  Cardiovascular: No lower extremity edema, non tender, no erythema  Skin: Warm dry intact with no signs of infection or rash on extremities or on axial skeleton.  Abdomen: Soft nontender  Neuro: Cranial nerves II through XII are intact, neurovascularly intact in all extremities with 2+ DTRs and 2+ pulses.  Lymph: No lymphadenopathy of posterior or anterior cervical chain or axillae bilaterally.  Gait normal with good balance and  coordination.  MSK:  Non tender with full range of motion and good stability and symmetric strength and tone of shoulders, elbows, wrist, hip and ankles bilaterally.  Knee: Left Normal to inspection with no erythema or effusion or obvious bony abnormalities. palpation over the medial joint line ROM full in flexion and extension and lower leg rotation. Ligaments with solid consistent endpoints including ACL, PCL, LCL, MCL. Negative Mcmurray's, Apley's, and Thessalonian tests. Non painful patellar compression. Patellar glide with mild crepitus. Patellar and quadriceps tendons unremarkable. Hamstring and quadriceps strength is normal. Contralateral knee unremarkable    Impression and Recommendations:     . The above documentation has been reviewed and is accurate and complete Judi Saa, DO       Note: This dictation was prepared with Dragon dictation along with smaller phrase technology. Any transcriptional errors that result from this process are unintentional.

## 2018-09-01 ENCOUNTER — Encounter: Payer: Self-pay | Admitting: Family Medicine

## 2018-09-01 ENCOUNTER — Other Ambulatory Visit: Payer: Self-pay

## 2018-09-01 ENCOUNTER — Ambulatory Visit: Payer: No Typology Code available for payment source | Admitting: Family Medicine

## 2018-09-01 DIAGNOSIS — M25562 Pain in left knee: Secondary | ICD-10-CM

## 2018-09-01 NOTE — Assessment & Plan Note (Signed)
Tender overall.  Discussed icing regimen and home exercise.  Which activities to do which was to avoid patient was given a running progression I think will do well.  Patient will continue with the vitamin D supplementation.  Follow-up again in 2 months

## 2018-09-01 NOTE — Patient Instructions (Signed)
Good to see you  MAking progress in 2 weeks start the running progression  Omega 3 instead of omega 6 foods when possible  Stay active Start a walk-run progression: 3 times a week and avoid back to back days running,  Once you have reached 30 mins: - Run 2 mins, then walk 1 min week 1 -Then run 3 mins, and walk 1 min week 2 -Then run 4 mins, and walk 1 min week 3 -Then run 5 mins, and walk 1 min week 4 -Slowly build up weekly to running 30 mins nonstop.  If painful at any of the steps, back up one step. Have an appointment in 8 weeks just in case but I think you will do great!

## 2018-09-20 ENCOUNTER — Encounter: Payer: Self-pay | Admitting: Family Medicine

## 2018-09-20 DIAGNOSIS — G43829 Menstrual migraine, not intractable, without status migrainosus: Secondary | ICD-10-CM

## 2018-09-21 MED ORDER — ZOLMITRIPTAN 5 MG PO TABS: ORAL_TABLET | ORAL | 5 refills | Status: DC

## 2018-09-21 MED FILL — ZOLMitriptan 5 MG TABS: 5 | 30 days supply | Qty: 10 | Fill #0

## 2018-10-27 ENCOUNTER — Ambulatory Visit: Payer: No Typology Code available for payment source | Admitting: Family Medicine

## 2018-11-02 NOTE — Progress Notes (Signed)
Amanda ScaleZach Wood D.O. [facility] [address] [place], [place] [address] Phone: [phone] Subjective:   Amanda Wood, Amanda Wood, am serving as a scribe for Dr. Antoine PrimasZachary Wood.    CC: Knee pain follow-up  BJY:[id number]:Subjective   09/04/2018: Tender overall.  Discussed icing regimen and home exercise.  Which activities to do which was to avoid patient was given a running progression I think will do well.  Patient will continue with the vitamin D supplementation.  Follow-up again in 2 months  Update 11/03/2018: Amanda Baloiffany L Wood is a 38 y.o. female coming in with complaint of left knee pain. Patient was to start running progression. Patient states that she is doing much better. Does not have pain with running.   Patient states that overall has been doing relatively well.  Would state that she is 95 200% better  Past Medical History:  Diagnosis Date  . Lactose intolerance   . Migraines   . Sciatica    left leg   No past surgical history on file. Social History   Socioeconomic History  . Marital status: Single    Spouse name: Not on file  . Number of children: Not on file  . Years of education: Not on file  . Highest education level: Not on file  Occupational History  . Not on file  Social Needs  . Financial resource strain: Not on file  . Food insecurity:    Worry: Not on file    Inability: Not on file  . Transportation needs:    Medical: Not on file    Non-medical: Not on file  Tobacco Use  . Smoking status: Never Smoker  . Smokeless tobacco: Never Used  Substance and Sexual Activity  . Alcohol use: Yes    Comment: 1-2 glass of wine a week  . Drug use: No  . Sexual activity: Not on file  Lifestyle  . Physical activity:    Days per week: Not on file    Minutes per session: Not on file  . Stress: Not on file  Relationships  . Social connections:    Talks on phone: Not on file    Gets together: Not on file    Attends religious service: Not on file    Active member of club  or organization: Not on file    Attends meetings of clubs or organizations: Not on file    Relationship status: Not on file  Other Topics Concern  . Not on file  Social History Narrative  . Not on file   No Known Allergies Family History  Problem Relation Age of Onset  . Hypertension Father   . Dementia Maternal Grandmother   . Stroke Paternal Grandmother        Current Outpatient Medications (Analgesics):  .  zolmitriptan (ZOMIG) 5 MG tablet, TAKE 1 TABLET BY MOUTH AS NEEDED FOR MIGRAINE.   Current Outpatient Medications (Other):  Marland Kitchen.  Multiple Vitamins-Minerals (MULTIVITAMIN WITH MINERALS) tablet, Take 1 tablet by mouth daily. .  Vitamin D, Ergocalciferol, (DRISDOL) 1.25 MG (50000 UT) CAPS capsule, Take 1 capsule (50,000 Units total) by mouth every 7 (seven) days.    Past medical history, social, surgical and family history all reviewed in electronic medical record.  No pertanent information unless stated regarding to the chief complaint.   Review of Systems:  No headache, visual changes, nausea, vomiting, diarrhea, constipation, dizziness, abdominal pain, skin rash, fevers, chills, night sweats, weight loss, swollen lymph nodes, body aches, joint swelling, muscle aches,  chest pain, shortness of breath, mood changes.   Objective  Blood pressure 112/82, pulse 91, height 5\' 6"  (1.676 m), weight 167 lb (75.8 kg), SpO2 97 %.  General: No apparent distress alert and oriented x3 mood and affect normal, dressed appropriately.  HEENT: Pupils equal, extraocular movements intact  Respiratory: Patient's speak in full sentences and does not appear short of breath  Cardiovascular: No lower extremity edema, non tender, no erythema  Skin: Warm dry intact with no signs of infection or rash on extremities or on axial skeleton.  Abdomen: Soft nontender  Neuro: Cranial nerves II through XII are intact, neurovascularly intact in all extremities with 2+ DTRs and 2+ pulses.  Lymph: No  lymphadenopathy of posterior or anterior cervical chain or axillae bilaterally.  Gait normal with good balance and coordination.  MSK:  Non tender with full range of motion and good stability and symmetric strength and tone of shoulders, elbows, wrist, hip, knee and ankles bilaterally.     Impression and Recommendations:    . The above documentation has been reviewed and is accurate and complete Amanda Saa, DO       Note: This dictation was prepared with Dragon dictation along with smaller phrase technology. Any transcriptional errors that result from this process are unintentional.

## 2018-11-03 ENCOUNTER — Other Ambulatory Visit: Payer: Self-pay

## 2018-11-03 ENCOUNTER — Ambulatory Visit (INDEPENDENT_AMBULATORY_CARE_PROVIDER_SITE_OTHER): Payer: No Typology Code available for payment source | Admitting: Family Medicine

## 2018-11-03 ENCOUNTER — Encounter: Payer: Self-pay | Admitting: Family Medicine

## 2018-11-03 DIAGNOSIS — M25562 Pain in left knee: Secondary | ICD-10-CM

## 2018-11-03 NOTE — Assessment & Plan Note (Signed)
Patient has been doing remarkably well and doing well with conservative therapy.  We discussed to lift any type of restrictions at this point and can run as necessary.  As long as patient is well will follow-up as needed

## 2018-11-27 ENCOUNTER — Telehealth: Payer: Self-pay | Admitting: *Deleted

## 2018-11-27 NOTE — Telephone Encounter (Signed)
Left message on machine to see if patient would like to keep appointment for 6/19 for cpe or reschedule.

## 2018-12-08 ENCOUNTER — Encounter: Payer: Self-pay | Admitting: Family Medicine

## 2018-12-08 ENCOUNTER — Ambulatory Visit (INDEPENDENT_AMBULATORY_CARE_PROVIDER_SITE_OTHER): Payer: No Typology Code available for payment source | Admitting: Family Medicine

## 2018-12-08 ENCOUNTER — Other Ambulatory Visit: Payer: Self-pay

## 2018-12-08 VITALS — BP 126/76 | HR 80 | Temp 98.3°F | Resp 18 | Ht 66.0 in | Wt 164.0 lb

## 2018-12-08 DIAGNOSIS — Z0000 Encounter for general adult medical examination without abnormal findings: Secondary | ICD-10-CM | POA: Diagnosis not present

## 2018-12-08 LAB — CBC WITH DIFFERENTIAL/PLATELET
Basophils Absolute: 0 10*3/uL (ref 0.0–0.1)
Basophils Relative: 0.7 % (ref 0.0–3.0)
Eosinophils Absolute: 0.2 10*3/uL (ref 0.0–0.7)
Eosinophils Relative: 3.6 % (ref 0.0–5.0)
HCT: 41.5 % (ref 36.0–46.0)
Hemoglobin: 13.9 g/dL (ref 12.0–15.0)
Lymphocytes Relative: 31.5 % (ref 12.0–46.0)
Lymphs Abs: 1.9 10*3/uL (ref 0.7–4.0)
MCHC: 33.6 g/dL (ref 30.0–36.0)
MCV: 91.5 fl (ref 78.0–100.0)
Monocytes Absolute: 0.5 10*3/uL (ref 0.1–1.0)
Monocytes Relative: 8.5 % (ref 3.0–12.0)
Neutro Abs: 3.4 10*3/uL (ref 1.4–7.7)
Neutrophils Relative %: 55.7 % (ref 43.0–77.0)
Platelets: 220 10*3/uL (ref 150.0–400.0)
RBC: 4.53 Mil/uL (ref 3.87–5.11)
RDW: 12.9 % (ref 11.5–15.5)
WBC: 6.1 10*3/uL (ref 4.0–10.5)

## 2018-12-08 LAB — COMPREHENSIVE METABOLIC PANEL
ALT: 10 U/L (ref 0–35)
AST: 11 U/L (ref 0–37)
Albumin: 4.6 g/dL (ref 3.5–5.2)
Alkaline Phosphatase: 54 U/L (ref 39–117)
BUN: 14 mg/dL (ref 6–23)
CO2: 25 mEq/L (ref 19–32)
Calcium: 9.7 mg/dL (ref 8.4–10.5)
Chloride: 103 mEq/L (ref 96–112)
Creatinine, Ser: 0.81 mg/dL (ref 0.40–1.20)
GFR: 78.21 mL/min (ref >60.00)
Glucose, Bld: 91 mg/dL (ref 70–99)
Potassium: 4.4 mEq/L (ref 3.5–5.1)
Sodium: 137 mEq/L (ref 135–145)
Total Bilirubin: 1 mg/dL (ref 0.2–1.2)
Total Protein: 6.8 g/dL (ref 6.0–8.3)

## 2018-12-08 LAB — LIPID PANEL
Cholesterol: 136 mg/dL (ref 0–200)
HDL: 57.2 mg/dL (ref >39.00)
LDL Cholesterol: 69 mg/dL (ref 0–99)
NonHDL: 78.78
Total CHOL/HDL Ratio: 2
Triglycerides: 48 mg/dL (ref 0.0–149.0)
VLDL: 9.6 mg/dL (ref 0.0–40.0)

## 2018-12-08 LAB — TSH: TSH: 2.43 u[IU]/mL (ref 0.35–4.50)

## 2018-12-08 NOTE — Patient Instructions (Signed)
Preventive Care 40-64 Years, Female Preventive care refers to lifestyle choices and visits with your health care provider that can promote health and wellness. What does preventive care include?   A yearly physical exam. This is also called an annual well check.  Dental exams once or twice a year.  Routine eye exams. Ask your health care provider how often you should have your eyes checked.  Personal lifestyle choices, including: ? Daily care of your teeth and gums. ? Regular physical activity. ? Eating a healthy diet. ? Avoiding tobacco and drug use. ? Limiting alcohol use. ? Practicing safe sex. ? Taking low-dose aspirin daily starting at age 50. ? Taking vitamin and mineral supplements as recommended by your health care provider. What happens during an annual well check? The services and screenings done by your health care provider during your annual well check will depend on your age, overall health, lifestyle risk factors, and family history of disease. Counseling Your health care provider may ask you questions about your:  Alcohol use.  Tobacco use.  Drug use.  Emotional well-being.  Home and relationship well-being.  Sexual activity.  Eating habits.  Work and work environment.  Method of birth control.  Menstrual cycle.  Pregnancy history. Screening You may have the following tests or measurements:  Height, weight, and BMI.  Blood pressure.  Lipid and cholesterol levels. These may be checked every 5 years, or more frequently if you are over 50 years old.  Skin check.  Lung cancer screening. You may have this screening every year starting at age 55 if you have a 30-pack-year history of smoking and currently smoke or have quit within the past 15 years.  Colorectal cancer screening. All adults should have this screening starting at age 50 and continuing until age 75. Your health care provider may recommend screening at age 45. You will have tests every  1-10 years, depending on your results and the type of screening test. People at increased risk should start screening at an earlier age. Screening tests may include: ? Guaiac-based fecal occult blood testing. ? Fecal immunochemical test (FIT). ? Stool DNA test. ? Virtual colonoscopy. ? Sigmoidoscopy. During this test, a flexible tube with a tiny camera (sigmoidoscope) is used to examine your rectum and lower colon. The sigmoidoscope is inserted through your anus into your rectum and lower colon. ? Colonoscopy. During this test, a long, thin, flexible tube with a tiny camera (colonoscope) is used to examine your entire colon and rectum.  Hepatitis C blood test.  Hepatitis B blood test.  Sexually transmitted disease (STD) testing.  Diabetes screening. This is done by checking your blood sugar (glucose) after you have not eaten for a while (fasting). You may have this done every 1-3 years.  Mammogram. This may be done every 1-2 years. Talk to your health care provider about when you should start having regular mammograms. This may depend on whether you have a family history of breast cancer.  BRCA-related cancer screening. This may be done if you have a family history of breast, ovarian, tubal, or peritoneal cancers.  Pelvic exam and Pap test. This may be done every 3 years starting at age 21. Starting at age 30, this may be done every 5 years if you have a Pap test in combination with an HPV test.  Bone density scan. This is done to screen for osteoporosis. You may have this scan if you are at high risk for osteoporosis. Discuss your test results, treatment options,   and if necessary, the need for more tests with your health care provider. Vaccines Your health care provider may recommend certain vaccines, such as:  Influenza vaccine. This is recommended every year.  Tetanus, diphtheria, and acellular pertussis (Tdap, Td) vaccine. You may need a Td booster every 10 years.  Varicella  vaccine. You may need this if you have not been vaccinated.  Zoster vaccine. You may need this after age 38.  Measles, mumps, and rubella (MMR) vaccine. You may need at least one dose of MMR if you were born in 1957 or later. You may also need a second dose.  Pneumococcal 13-valent conjugate (PCV13) vaccine. You may need this if you have certain conditions and were not previously vaccinated.  Pneumococcal polysaccharide (PPSV23) vaccine. You may need one or two doses if you smoke cigarettes or if you have certain conditions.  Meningococcal vaccine. You may need this if you have certain conditions.  Hepatitis A vaccine. You may need this if you have certain conditions or if you travel or work in places where you may be exposed to hepatitis A.  Hepatitis B vaccine. You may need this if you have certain conditions or if you travel or work in places where you may be exposed to hepatitis B.  Haemophilus influenzae type b (Hib) vaccine. You may need this if you have certain conditions. Talk to your health care provider about which screenings and vaccines you need and how often you need them. This information is not intended to replace advice given to you by your health care provider. Make sure you discuss any questions you have with your health care provider. Document Released: 07/04/2015 Document Revised: 07/28/2017 Document Reviewed: 04/08/2015 Elsevier Interactive Patient Education  2019 [employer].

## 2018-12-08 NOTE — Progress Notes (Signed)
Subjective:     Amanda Wood is a 38 y.o. female and is here for a comprehensive physical exam. The patient reports no problems.  Social History   Socioeconomic History  . Marital status: Single    Spouse name: Not on file  . Number of children: Not on file  . Years of education: Not on file  . Highest education level: Not on file  Occupational History  . Occupation: physician    Employer: [employer]  Social Needs  . Financial resource strain: Not on file  . Food insecurity    Worry: Not on file    Inability: Not on file  . Transportation needs    Medical: Not on file    Non-medical: Not on file  Tobacco Use  . Smoking status: Never Smoker  . Smokeless tobacco: Never Used  Substance and Sexual Activity  . Alcohol use: Yes    Comment: 1-2 glass of wine a week  . Drug use: No  . Sexual activity: Yes    Partners: Male    Birth control/protection: Condom  Lifestyle  . Physical activity    Days per week: Not on file    Minutes per session: Not on file  . Stress: Not on file  Relationships  . Social Musicianconnections    Talks on phone: Not on file    Gets together: Not on file    Attends religious service: Not on file    Active member of club or organization: Not on file    Attends meetings of clubs or organizations: Not on file    Relationship status: Not on file  . Intimate partner violence    Fear of current or ex partner: Not on file    Emotionally abused: Not on file    Physically abused: Not on file    Forced sexual activity: Not on file  Other Topics Concern  . Not on file  Social History Narrative  . Not on file   Health Maintenance  Topic Date Due  . HIV Screening  09/06/1993  . INFLUENZA VACCINE  01/20/2019  . PAP SMEAR-Modifier  12/02/2020  . TETANUS/TDAP  11/27/2026    The following portions of the patient's history were reviewed and updated as appropriate:  She  has a past medical history of Lactose intolerance, Migraines, and Sciatica. She does  not have any pertinent problems on file. She  has no past surgical history on file. Her family history includes Dementia in her maternal grandmother; Hypertension in her father; Stroke in her paternal grandmother. She  reports that she has never smoked. She has never used smokeless tobacco. She reports current alcohol use. She reports that she does not use drugs. She has a current medication list which includes the following prescription(s): multivitamin with minerals, zolmitriptan, and vitamin d (ergocalciferol). Current Outpatient Medications on File Prior to Visit  Medication Sig Dispense Refill  . Multiple Vitamins-Minerals (MULTIVITAMIN WITH MINERALS) tablet Take 1 tablet by mouth daily.    Marland Kitchen. zolmitriptan (ZOMIG) 5 MG tablet TAKE 1 TABLET BY MOUTH AS NEEDED FOR MIGRAINE. 10 tablet 5  . Vitamin D, Ergocalciferol, (DRISDOL) 1.25 MG (50000 UT) CAPS capsule Take 1 capsule (50,000 Units total) by mouth every 7 (seven) days. 12 capsule 0   No current facility-administered medications on file prior to visit.    She has No Known Allergies..  Review of Systems -  Review of Systems  Constitutional: Negative for activity change, appetite change and fatigue.  HENT: Negative  for hearing loss, congestion, tinnitus and ear discharge.  dentist q68m Eyes: Negative for visual disturbance (see optho q1y -- vision corrected to 20/20 with glasses).  Respiratory: Negative for cough, chest tightness and shortness of breath.   Cardiovascular: Negative for chest pain, palpitations and leg swelling.  Gastrointestinal: Negative for abdominal pain, diarrhea, constipation and abdominal distention.  Genitourinary: Negative for urgency, frequency, decreased urine volume and difficulty urinating.  Musculoskeletal: Negative for back pain, arthralgias and gait problem.  Skin: Negative for color change, pallor and rash.  Neurological: Negative for dizziness, light-headedness, numbness and headaches.  Hematological:  Negative for adenopathy. Does not bruise/bleed easily.  Psychiatric/Behavioral: Negative for suicidal ideas, confusion, sleep disturbance, self-injury, dysphoric mood, decreased concentration and agitation.       Objective:    BP 126/76 (BP Location: Right Arm, Patient Position: Sitting, Cuff Size: Normal)   Pulse 80   Temp 98.3 F (36.8 C) (Oral)   Resp 18   Ht 5\' 6"  (1.676 m)   Wt 164 lb (74.4 kg)   SpO2 100%   BMI 26.47 kg/m  General appearance: alert, cooperative, appears stated age and no distress Head: Normocephalic, without obvious abnormality, atraumatic Eyes: conjunctivae/corneas clear. PERRL, EOM's intact. Fundi benign. Ears: normal TM's and external ear canals both ears Nose: Nares normal. Septum midline. Mucosa normal. No drainage or sinus tenderness. Throat: lips, mucosa, and tongue normal; teeth and gums normal Neck: no adenopathy, no carotid bruit, no JVD, supple, symmetrical, trachea midline and thyroid not enlarged, symmetric, no tenderness/mass/nodules Back: symmetric, no curvature. ROM normal. No CVA tenderness. Lungs: clear to auscultation bilaterally Breasts: normal appearance, no masses or tenderness Heart: regular rate and rhythm, S1, S2 normal, no murmur, click, rub or gallop Abdomen: soft, non-tender; bowel sounds normal; no masses,  no organomegaly Pelvic: deferred Extremities: extremities normal, atraumatic, no cyanosis or edema Pulses: 2+ and symmetric Skin: Skin color, texture, turgor normal. No rashes or lesions Lymph nodes: Cervical, supraclavicular, and axillary nodes normal. Neurologic: Alert and oriented X 3, normal strength and tone. Normal symmetric reflexes. Normal coordination and gait    Assessment:    Healthy female exam.      Plan:    ghm utd Check labs  See After Visit Summary for Counseling Recommendations

## 2018-12-15 MED FILL — ZOLMitriptan 5 MG TABS: 5 | 30 days supply | Qty: 10 | Fill #1

## 2019-01-10 MED FILL — ZOLMitriptan 5 MG TABS: 5 | 30 days supply | Qty: 10 | Fill #1

## 2019-03-25 MED FILL — ZOLMitriptan 5 MG TABS: 5 | 30 days supply | Qty: 10 | Fill #2

## 2019-03-26 ENCOUNTER — Encounter: Payer: Self-pay | Admitting: Family Medicine

## 2019-04-16 MED FILL — ZOLMitriptan 5 MG TABS: 5 | 30 days supply | Qty: 10 | Fill #2

## 2019-06-20 MED FILL — ZOLMitriptan 5 MG TABS: 5 | 30 days supply | Qty: 10 | Fill #3

## 2019-07-09 ENCOUNTER — Encounter: Payer: Self-pay | Admitting: Family Medicine

## 2019-08-12 MED FILL — ZOLMitriptan 5 MG TABS: 5 | 30 days supply | Qty: 10 | Fill #4

## 2019-09-21 ENCOUNTER — Encounter: Payer: Self-pay | Admitting: Family Medicine

## 2019-09-24 ENCOUNTER — Other Ambulatory Visit: Payer: Self-pay | Admitting: Family Medicine

## 2019-09-24 DIAGNOSIS — M79672 Pain in left foot: Secondary | ICD-10-CM

## 2019-10-02 ENCOUNTER — Encounter: Payer: Self-pay | Admitting: Family Medicine

## 2019-10-02 ENCOUNTER — Other Ambulatory Visit: Payer: Self-pay

## 2019-10-02 ENCOUNTER — Other Ambulatory Visit: Payer: Self-pay | Admitting: Family Medicine

## 2019-10-02 ENCOUNTER — Ambulatory Visit (INDEPENDENT_AMBULATORY_CARE_PROVIDER_SITE_OTHER): Payer: No Typology Code available for payment source

## 2019-10-02 ENCOUNTER — Ambulatory Visit: Payer: No Typology Code available for payment source | Admitting: Family Medicine

## 2019-10-02 VITALS — BP 124/84 | HR 84 | Ht 66.0 in | Wt 166.0 lb

## 2019-10-02 DIAGNOSIS — M79672 Pain in left foot: Secondary | ICD-10-CM | POA: Diagnosis not present

## 2019-10-02 DIAGNOSIS — G43829 Menstrual migraine, not intractable, without status migrainosus: Secondary | ICD-10-CM

## 2019-10-02 MED ORDER — VITAMIN D (ERGOCALCIFEROL) 1.25 MG (50000 UNIT) PO CAPS: 50000.0000 [IU] | ORAL_CAPSULE | ORAL | 0 refills | Status: DC

## 2019-10-02 MED FILL — VIT D2 1.25 MG (50,000 UNIT: 1.25 MG | 84 days supply | Qty: 12 | Fill #0

## 2019-10-02 MED FILL — ZOLMitriptan 5 MG TABS: 5 | 30 days supply | Qty: 10 | Fill #0

## 2019-10-02 NOTE — Patient Instructions (Signed)
Shoes in the house No cute shoes Once Weekly Vit D Continue K2 [place] [email or web address] No running for 2 weeks, consider more biking or elliptical See me in 5 weeks

## 2019-10-02 NOTE — Progress Notes (Signed)
[facility] [place] Phone: [phone] Subjective:   Amanda [name], am serving as a scribe for Dr. Hulan [name]. This visit occurred during the SARS-CoV-2 public health emergency.  Safety protocols were in place, including screening questions prior to the visit, additional usage of staff PPE, and extensive cleaning of exam room while observing appropriate contact time as indicated for disinfecting solutions.   I'm seeing this patient by the request  of:  Amanda Held, DO  CC: left foot pain   [id number]  Amanda [name] is a 39 y.o. female coming in with complaint of left foot pain. Last seen in May 2020 for left knee pain. Patient states that she has been having pain over the medial dorsum of foot after training for 1/2 marathon which was the end of March. Foot pain is less with supportive shoe wear. Patient has been walking and this exacerbates her pain.      Past Medical History:  Diagnosis Date  . Lactose intolerance   . Migraines   . Sciatica    left leg   [name] past surgical history on file. Social History   Socioeconomic History  . Marital status: Single    Spouse name: Not on file  . Number of children: Not on file  . Years of education: Not on file  . Highest education level: Not on file  Occupational History  . Occupation: physician    Employer: Amanda [name]  Tobacco Use  . Smoking status: Never Smoker  . Smokeless tobacco: Never Used  Substance and Sexual Activity  . Alcohol use: Yes    Comment: 1-2 glass of wine a week  . Drug use: [name]  . Sexual activity: Yes    Partners: Male    Birth control/protection: Condom  Other Topics Concern  . Not on file  Social History Narrative  . Not on file   Social Determinants of Health   Financial Resource Strain:   . Difficulty of Paying Living Expenses:   Food Insecurity:   . Worried About Charity fundraiser in the Last Year:   . Academic librarian in the Last Year:   Transportation Needs:   . Film/video editor (Medical):   Marland Kitchen Lack of Transportation (Non-Medical):   Physical Activity:   . Days of Exercise per Week:   . Minutes of Exercise per Session:   Stress:   . Feeling of Stress :   Social Connections:   . Frequency of Communication with Friends and Family:   . Frequency of Social Gatherings with Friends and Family:   . Attends Religious Services:   . Active Member of Clubs or Organizations:   . Attends Archivist Meetings:   Marland Kitchen Marital Status:    [name] Known Allergies Family History  Problem Relation Age of Onset  . Hypertension Father   . Osteoarthritis Father   . Dementia Maternal Grandmother   . Stroke Paternal Grandmother   . Osteoarthritis Mother        Current Outpatient Medications (Analgesics):  .  zolmitriptan (ZOMIG) 5 MG tablet, TAKE 1 TABLET BY MOUTH AS NEEDED FOR MIGRAINE.   Current Outpatient Medications (Other):  Marland Kitchen  Multiple Vitamins-Minerals (MULTIVITAMIN WITH MINERALS) tablet, Take 1 tablet by mouth daily. .  Vitamin D, Ergocalciferol, (DRISDOL) 1.25 MG (50000 UNIT) CAPS capsule, Take 1 capsule (50,000 Units total) by mouth every 7 (seven) days. .  Vitamin D, Ergocalciferol, (DRISDOL)  1.25 MG (50000 UT) CAPS capsule, Take 1 capsule (50,000 Units total) by mouth every 7 (seven) days.   Reviewed prior external information including notes and imaging from  primary care provider As well as notes that were available from care everywhere and other healthcare systems.  Past medical history, social, surgical and family history all reviewed in electronic medical record.  [name] pertanent information unless stated regarding to the chief complaint.   Review of Systems:  [name] headache, visual changes, nausea, vomiting, diarrhea, constipation, dizziness, abdominal pain, skin rash, fevers, chills, night sweats, weight loss, swollen lymph nodes, body aches, joint swelling, chest pain, shortness of  breath, mood changes. POSITIVE muscle aches  Objective  Blood pressure 124/84, pulse 84, height 5\' 6"  (1.676 m), weight 166 lb (75.3 kg), SpO2 98 %.   General: [name] apparent distress alert and oriented x3 mood and affect normal, dressed appropriately.  HEENT: Pupils equal, extraocular movements intact  Respiratory: Patient's speak in full sentences and does not appear short of breath  Cardiovascular: [name] lower extremity edema, non tender, [name] erythema  Neuro: Cranial nerves II through XII are intact, neurovascularly intact in all extremities with 2+ DTRs and 2+ pulses.  Gait normal with good balance and coordination.  MSK:  Non tender with full range of motion and good stability and symmetric strength and tone of shoulders, elbows, wrist, hip, knee and ankles bilaterally.  Foot exam shows patient does have some mild tenderness noted over the dorsal aspect of the foot more medial than lateral.  [name] pain over the navicular prominence.  First MTP mild limitus but [name] hallux rigidus noted.  [name] erythema noted.  Ankle has full range of motion.  Ltd msk u/s preformed and interpreted by  Limited musculoskeletal ultrasound shows the patient has very nonspecific hypoechoic changes over the dorsal aspect of the foot but [name] true cortical defect noted.  Very minimal area of the increase in neovascularization.  [name] masses appreciated.  Impression: Questionable healing stress reaction    Impression and Recommendations:     This case required medical decision making of moderate complexity. The above documentation has been reviewed and is accurate and complete Amanda Saa, DO       Note: This dictation was prepared with Dragon dictation along with smaller phrase technology. Any transcriptional errors that result from this process are unintentional.

## 2019-10-02 NOTE — Assessment & Plan Note (Signed)
Left knee pain.  Discussed with patient in great length.  Hand second potential stress reaction.  Once weekly vitamin D started again.  Discussed icing regimen.  Topical anti-inflammatories.  Discussed proper shoes and avoiding being barefoot or anything with the heel at the moment.  Patient will try more low impact exercising for now and then we discussed the potential for more of a off-season if necessary.  Follow-up again 4 weeks

## 2019-11-13 ENCOUNTER — Ambulatory Visit (INDEPENDENT_AMBULATORY_CARE_PROVIDER_SITE_OTHER): Payer: No Typology Code available for payment source

## 2019-11-13 ENCOUNTER — Ambulatory Visit (INDEPENDENT_AMBULATORY_CARE_PROVIDER_SITE_OTHER): Payer: No Typology Code available for payment source | Admitting: Family Medicine

## 2019-11-13 ENCOUNTER — Other Ambulatory Visit: Payer: Self-pay

## 2019-11-13 ENCOUNTER — Encounter: Payer: Self-pay | Admitting: Family Medicine

## 2019-11-13 VITALS — BP 118/80 | HR 89 | Ht 66.0 in | Wt 168.0 lb

## 2019-11-13 DIAGNOSIS — M79672 Pain in left foot: Secondary | ICD-10-CM | POA: Diagnosis not present

## 2019-11-13 NOTE — Progress Notes (Signed)
[place] Phone: [phone] Subjective:   Amanda Wood, am serving as a scribe for Dr. Hulan Saas. This visit occurred during the SARS-CoV-2 public health emergency.  Safety protocols were in place, including screening questions prior to the visit, additional usage of staff PPE, and extensive cleaning of exam room while observing appropriate contact time as indicated for disinfecting solutions.   I'm seeing this patient by the request  of:  Ann Held, DO  CC: Foot pain follow-up  ACZ:[id number]   4/13/20201 Left knee pain.  Discussed with patient in great length.  Hand second potential stress reaction.  Once weekly vitamin D started again.  Discussed icing regimen.  Topical anti-inflammatories.  Discussed proper shoes and avoiding being barefoot or anything with the heel at the moment.  Patient will try more low impact exercising for now and then we discussed the potential for more of a off-season if necessary.  Follow-up again 4 weeks  Update 11/13/2019 Amanda Wood is a 39 y.o. female coming in with complaint of left foot pain. Patient states that her Brooks shoes put pressure over the painful area. Patient is wearing flat athletic shoes today. Is doing cycling and weights. Walked 5 miles this weekend. Pain is intermittent despite activity. Pain over medial aspect of foot into medial arch and great toe. Was able to run 2 miles. Pain was intermittent during and after a run.       Past Medical History:  Diagnosis Date  . Lactose intolerance   . Migraines   . Sciatica    left leg   Wood past surgical history on file. Social History   Socioeconomic History  . Marital status: Single    Spouse name: Not on file  . Number of children: Not on file  . Years of education: Not on file  . Highest education level: Not on file  Occupational History  . Occupation: physician    Employer: [employer]    Tobacco Use  . Smoking status: Never Smoker  . Smokeless tobacco: Never Used  Substance and Sexual Activity  . Alcohol use: Yes    Comment: 1-2 glass of wine a week  . Drug use: Wood  . Sexual activity: Yes    Partners: Male    Birth control/protection: Condom  Other Topics Concern  . Not on file  Social History Narrative  . Not on file   Social Determinants of Health   Financial Resource Strain:   . Difficulty of Paying Living Expenses:   Food Insecurity:   . Worried About Charity fundraiser in the Last Year:   . Arboriculturist in the Last Year:   Transportation Needs:   . Film/video editor (Medical):   Marland Kitchen Lack of Transportation (Non-Medical):   Physical Activity:   . Days of Exercise per Week:   . Minutes of Exercise per Session:   Stress:   . Feeling of Stress :   Social Connections:   . Frequency of Communication with Friends and Family:   . Frequency of Social Gatherings with Friends and Family:   . Attends Religious Services:   . Active Member of Clubs or Organizations:   . Attends Archivist Meetings:   Marland Kitchen Marital Status:    Wood Known Allergies Family History  Problem Relation Age of Onset  . Hypertension Father   . Osteoarthritis Father   . Dementia Maternal Grandmother   .  Stroke Paternal Grandmother   . Osteoarthritis Mother        Current Outpatient Medications (Analgesics):  .  zolmitriptan (ZOMIG) 5 MG tablet, TAKE 1 TABLET BY MOUTH AS NEEDED FOR MIGRAINE.   Current Outpatient Medications (Other):  Marland Kitchen  Multiple Vitamins-Minerals (MULTIVITAMIN WITH MINERALS) tablet, Take 1 tablet by mouth daily. .  Vitamin D, Ergocalciferol, (DRISDOL) 1.25 MG (50000 UNIT) CAPS capsule, Take 1 capsule (50,000 Units total) by mouth every 7 (seven) days. .  Vitamin D, Ergocalciferol, (DRISDOL) 1.25 MG (50000 UT) CAPS capsule, Take 1 capsule (50,000 Units total) by mouth every 7 (seven) days.   Reviewed prior external information including notes and  imaging from  primary care provider As well as notes that were available from care everywhere and other healthcare systems.  Past medical history, social, surgical and family history all reviewed in electronic medical record.  Wood pertanent information unless stated regarding to the chief complaint.   Review of Systems:  Wood headache, visual changes, nausea, vomiting, diarrhea, constipation, dizziness, abdominal pain, skin rash, fevers, chills, night sweats, weight loss, swollen lymph nodes, body aches, joint swelling, chest pain, shortness of breath, mood changes. POSITIVE muscle aches  Objective  Blood pressure 118/80, pulse 89, height 5\' 6"  (1.676 m), weight 168 lb (76.2 kg), SpO2 98 %.   General: Wood apparent distress alert and oriented x3 mood and affect normal, dressed appropriately.  HEENT: Pupils equal, extraocular movements intact  Respiratory: Patient's speak in full sentences and does not appear short of breath  Cardiovascular: Wood lower extremity edema, non tender, Wood erythema  Neuro: Cranial nerves II through XII are intact, neurovascularly intact in all extremities with 2+ DTRs and 2+ pulses.  Gait normal with good balance and coordination.  MSK:  Non tender with full range of motion and good stability and symmetric strength and tone of shoulders, elbows, wrist, hip, knee and ankles bilaterally.  Foot exam today shows the patient is doing relatively well.  Some mild tenderness over the first metatarsal but improvement over the dorsal aspect of the foot that patient had previously.  Limited musculoskeletal ultrasound was performed and interpreted by  Limited musculoskeletal ultrasound showed that patient did have a small effusion noted of the joint. Impression: Mild capsulitis   Impression and Recommendations:     This case required medical decision making of moderate complexity. The above documentation has been reviewed and is accurate and complete Judi Saa, DO       Note: This dictation was prepared with Dragon dictation along with smaller phrase technology. Any transcriptional errors that result from this process are unintentional.

## 2019-11-13 NOTE — Assessment & Plan Note (Signed)
Very mild capsulitis noted today.  Overall seems to be improving.  We discussed with patient in great length about icing regimen and home exercises.  We discussed about the potential for different shoes in the house.  I do believe patient will do well with conservative therapy.  Follow-up again in 6 to 8 weeks and will consider injection if necessary

## 2019-11-13 NOTE — Patient Instructions (Signed)
Ice after running OOFOS or HOKA recovery sandals See me in 6-8 weeks

## 2019-12-14 ENCOUNTER — Encounter: Payer: No Typology Code available for payment source | Admitting: Family Medicine

## 2019-12-21 ENCOUNTER — Ambulatory Visit (INDEPENDENT_AMBULATORY_CARE_PROVIDER_SITE_OTHER): Payer: No Typology Code available for payment source | Admitting: Family Medicine

## 2019-12-21 ENCOUNTER — Other Ambulatory Visit (HOSPITAL_BASED_OUTPATIENT_CLINIC_OR_DEPARTMENT_OTHER): Payer: Self-pay | Admitting: Family Medicine

## 2019-12-21 ENCOUNTER — Ambulatory Visit (HOSPITAL_BASED_OUTPATIENT_CLINIC_OR_DEPARTMENT_OTHER)
Admission: RE | Admit: 2019-12-21 | Discharge: 2019-12-21 | Disposition: A | Discharge status: Home / Self Care | Payer: No Typology Code available for payment source | Source: Ambulatory Visit | Attending: Family Medicine | Admitting: Family Medicine

## 2019-12-21 ENCOUNTER — Other Ambulatory Visit: Payer: Self-pay

## 2019-12-21 ENCOUNTER — Encounter: Payer: Self-pay | Admitting: Family Medicine

## 2019-12-21 VITALS — BP 106/74 | HR 82 | Temp 97.4°F | Resp 18 | Ht 66.0 in | Wt 170.2 lb

## 2019-12-21 DIAGNOSIS — Z1159 Encounter for screening for other viral diseases: Secondary | ICD-10-CM

## 2019-12-21 DIAGNOSIS — Z1231 Encounter for screening mammogram for malignant neoplasm of breast: Secondary | ICD-10-CM | POA: Diagnosis not present

## 2019-12-21 DIAGNOSIS — Z0000 Encounter for general adult medical examination without abnormal findings: Secondary | ICD-10-CM

## 2019-12-21 LAB — CBC WITH DIFFERENTIAL/PLATELET
Basophils Absolute: 0 10*3/uL (ref 0.0–0.1)
Basophils Relative: 0.7 % (ref 0.0–3.0)
Eosinophils Absolute: 0.2 10*3/uL (ref 0.0–0.7)
Eosinophils Relative: 3 % (ref 0.0–5.0)
HCT: 40.7 % (ref 36.0–46.0)
Hemoglobin: 13.6 g/dL (ref 12.0–15.0)
Lymphocytes Relative: 27.2 % (ref 12.0–46.0)
Lymphs Abs: 1.7 10*3/uL (ref 0.7–4.0)
MCHC: 33.4 g/dL (ref 30.0–36.0)
MCV: 91.6 fl (ref 78.0–100.0)
Monocytes Absolute: 0.4 10*3/uL (ref 0.1–1.0)
Monocytes Relative: 6.6 % (ref 3.0–12.0)
Neutro Abs: 3.8 10*3/uL (ref 1.4–7.7)
Neutrophils Relative %: 62.5 % (ref 43.0–77.0)
Platelets: 215 10*3/uL (ref 150.0–400.0)
RBC: 4.44 Mil/uL (ref 3.87–5.11)
RDW: 13.2 % (ref 11.5–15.5)
WBC: 6.1 10*3/uL (ref 4.0–10.5)

## 2019-12-21 LAB — COMPREHENSIVE METABOLIC PANEL
ALT: 30 U/L (ref 0–35)
AST: 18 U/L (ref 0–37)
Albumin: 4.6 g/dL (ref 3.5–5.2)
Alkaline Phosphatase: 55 U/L (ref 39–117)
BUN: 12 mg/dL (ref 6–23)
CO2: 29 mEq/L (ref 19–32)
Calcium: 9.9 mg/dL (ref 8.4–10.5)
Chloride: 102 mEq/L (ref 96–112)
Creatinine, Ser: 0.83 mg/dL (ref 0.40–1.20)
GFR: 75.65 mL/min (ref >60.00)
Glucose, Bld: 99 mg/dL (ref 70–99)
Potassium: 4.2 mEq/L (ref 3.5–5.1)
Sodium: 137 mEq/L (ref 135–145)
Total Bilirubin: 0.7 mg/dL (ref 0.2–1.2)
Total Protein: 7.1 g/dL (ref 6.0–8.3)

## 2019-12-21 LAB — LIPID PANEL
Cholesterol: 186 mg/dL (ref 0–200)
HDL: 70 mg/dL (ref >39.00)
LDL Cholesterol: 102 mg/dL — ABNORMAL HIGH (ref 0–99)
NonHDL: 115.55
Total CHOL/HDL Ratio: 3
Triglycerides: 70 mg/dL (ref 0.0–149.0)
VLDL: 14 mg/dL (ref 0.0–40.0)

## 2019-12-21 LAB — TSH: TSH: 3.55 u[IU]/mL (ref 0.35–4.50)

## 2019-12-21 NOTE — Progress Notes (Signed)
Subjective:     Amanda Wood is a 39 y.o. female and is here for a comprehensive physical exam. The patient reports no problems.  Social History   Socioeconomic History  . Marital status: Single    Spouse name: Not on file  . Number of children: Not on file  . Years of education: Not on file  . Highest education level: Not on file  Occupational History  . Occupation: physician    Employer: [employer]  Tobacco Use  . Smoking status: Never Smoker  . Smokeless tobacco: Never Used  Substance and Sexual Activity  . Alcohol use: Yes    Comment: 1-2 glass of wine a week  . Drug use: No  . Sexual activity: Yes    Partners: Male    Birth control/protection: Condom  Other Topics Concern  . Not on file  Social History Narrative  . Not on file   Social Determinants of Health   Financial Resource Strain:   . Difficulty of Paying Living Expenses:   Food Insecurity:   . Worried About Programme researcher, broadcasting/film/video in the Last Year:   . Barista in the Last Year:   Transportation Needs:   . Freight forwarder (Medical):   Marland Kitchen Lack of Transportation (Non-Medical):   Physical Activity:   . Days of Exercise per Week:   . Minutes of Exercise per Session:   Stress:   . Feeling of Stress :   Social Connections:   . Frequency of Communication with Friends and Family:   . Frequency of Social Gatherings with Friends and Family:   . Attends Religious Services:   . Active Member of Clubs or Organizations:   . Attends Banker Meetings:   Marland Kitchen Marital Status:   Intimate Partner Violence:   . Fear of Current or Ex-Partner:   . Emotionally Abused:   Marland Kitchen Physically Abused:   . Sexually Abused:    Health Maintenance  Topic Date Due  . Hepatitis C Screening  Never done  . HIV Screening  Never done  . INFLUENZA VACCINE  01/20/2020  . PAP SMEAR-Modifier  12/02/2020  . TETANUS/TDAP  11/27/2026  . COVID-19 Vaccine  Completed    The following portions of the patient's history  were reviewed and updated as appropriate:  She  has a past medical history of Lactose intolerance, Migraines, and Sciatica. She does not have any pertinent problems on file. She  has no past surgical history on file. Her family history includes Dementia in her maternal grandmother; Hypertension in her father; Osteoarthritis in her father and mother; Stroke in her paternal grandmother. She  reports that she has never smoked. She has never used smokeless tobacco. She reports current alcohol use. She reports that she does not use drugs. She has a current medication list which includes the following prescription(s): multivitamin with minerals, vitamin d (ergocalciferol), and zolmitriptan. Current Outpatient Medications on File Prior to Visit  Medication Sig Dispense Refill  . Multiple Vitamins-Minerals (MULTIVITAMIN WITH MINERALS) tablet Take 1 tablet by mouth daily.    . Vitamin D, Ergocalciferol, (DRISDOL) 1.25 MG (50000 UNIT) CAPS capsule Take 1 capsule (50,000 Units total) by mouth every 7 (seven) days. 12 capsule 0  . zolmitriptan (ZOMIG) 5 MG tablet TAKE 1 TABLET BY MOUTH AS NEEDED FOR MIGRAINE. 10 tablet 5   No current facility-administered medications on file prior to visit.   She has No Known Allergies..  Review of Systems Review of Systems  Constitutional: Negative for activity change, appetite change and fatigue.  HENT: Negative for hearing loss, congestion, tinnitus and ear discharge.  dentist q48m Eyes: Negative for visual disturbance (see optho q1y -- vision corrected to 20/20 with glasses).  Respiratory: Negative for cough, chest tightness and shortness of breath.   Cardiovascular: Negative for chest pain, palpitations and leg swelling.  Gastrointestinal: Negative for abdominal pain, diarrhea, constipation and abdominal distention.  Genitourinary: Negative for urgency, frequency, decreased urine volume and difficulty urinating.  Musculoskeletal: Negative for back pain, arthralgias  and gait problem.  Skin: Negative for color change, pallor and rash.  Neurological: Negative for dizziness, light-headedness, numbness and headaches.  Hematological: Negative for adenopathy. Does not bruise/bleed easily.  Psychiatric/Behavioral: Negative for suicidal ideas, confusion, sleep disturbance, self-injury, dysphoric mood, decreased concentration and agitation.       Objective:    BP 106/74 (BP Location: Right Arm, Patient Position: Sitting, Cuff Size: Normal)   Pulse 82   Temp (!) 97.4 F (36.3 C) (Temporal)   Resp 18   Ht 5\' 6"  (1.676 m)   Wt 170 lb 3.2 oz (77.2 kg)   LMP 11/30/2019   SpO2 98%   BMI 27.47 kg/m  General appearance: alert, cooperative, appears stated age and no distress Head: Normocephalic, without obvious abnormality, atraumatic Eyes: negative findings: lids and lashes normal and pupils equal, round, reactive to light and accomodation Ears: normal TM's and external ear canals both ears Neck: no adenopathy, no carotid bruit, no JVD, supple, symmetrical, trachea midline and thyroid not enlarged, symmetric, no tenderness/mass/nodules Back: symmetric, no curvature. ROM normal. No CVA tenderness. Lungs: clear to auscultation bilaterally Breasts: normal appearance, no masses or tenderness Heart: regular rate and rhythm, S1, S2 normal, no murmur, click, rub or gallop Abdomen: soft, non-tender; bowel sounds normal; no masses,  no organomegaly Pelvic: deferred Extremities: extremities normal, atraumatic, no cyanosis or edema Pulses: 2+ and symmetric Skin: Skin color, texture, turgor normal. No rashes or lesions Lymph nodes: Cervical, supraclavicular, and axillary nodes normal. Neurologic: Alert and oriented X 3, normal strength and tone. Normal symmetric reflexes. Normal coordination and gait    Assessment:    Healthy female exam.      Plan:    ghm utd Check labs  See After Visit Summary for Counseling Recommendations

## 2019-12-21 NOTE — Patient Instructions (Signed)

## 2019-12-25 LAB — HEPATITIS C ANTIBODY
Hepatitis C Ab: NONREACTIVE
SIGNAL TO CUT-OFF: 0 (ref <1.00)

## 2019-12-27 ENCOUNTER — Encounter: Payer: Self-pay | Admitting: Family Medicine

## 2020-01-01 ENCOUNTER — Ambulatory Visit: Payer: No Typology Code available for payment source | Admitting: Family Medicine

## 2020-01-16 MED FILL — ZOLMitriptan 5 MG TABS: 5 | 30 days supply | Qty: 10 | Fill #2

## 2020-03-13 MED FILL — ZOLMitriptan 5 MG TABS: 5 | 30 days supply | Qty: 10 | Fill #3

## 2020-04-13 ENCOUNTER — Encounter: Payer: Self-pay | Admitting: Family Medicine

## 2020-04-14 NOTE — Telephone Encounter (Signed)
noted 

## 2020-04-29 MED FILL — ZOLMitriptan 5 MG TABS: 5 | 30 days supply | Qty: 10 | Fill #4

## 2020-06-09 MED FILL — ZOLMitriptan 5 MG TABS: 5 | 30 days supply | Qty: 10 | Fill #5

## 2020-07-28 ENCOUNTER — Other Ambulatory Visit: Payer: Self-pay | Admitting: Family Medicine

## 2020-07-28 DIAGNOSIS — G43829 Menstrual migraine, not intractable, without status migrainosus: Secondary | ICD-10-CM

## 2020-07-28 MED FILL — ZOLMitriptan 5 MG TABS: 5 | 30 days supply | Qty: 10 | Fill #0

## 2020-10-08 ENCOUNTER — Other Ambulatory Visit (HOSPITAL_COMMUNITY): Payer: Self-pay

## 2020-10-08 MED FILL — Zolmitriptan Tab 5 MG: ORAL | 30 days supply | Qty: 10 | Fill #0 | Status: AC

## 2020-10-21 IMAGING — MG DIGITAL SCREENING BILAT W/ TOMO W/ CAD
6 of 12 series · 6 of 36 positions shown · non-contrast
Comparison: None.

CLINICAL DATA: Screening.

EXAM:
DIGITAL SCREENING BILATERAL MAMMOGRAM WITH TOMO AND CAD

[R CC synth-2D]
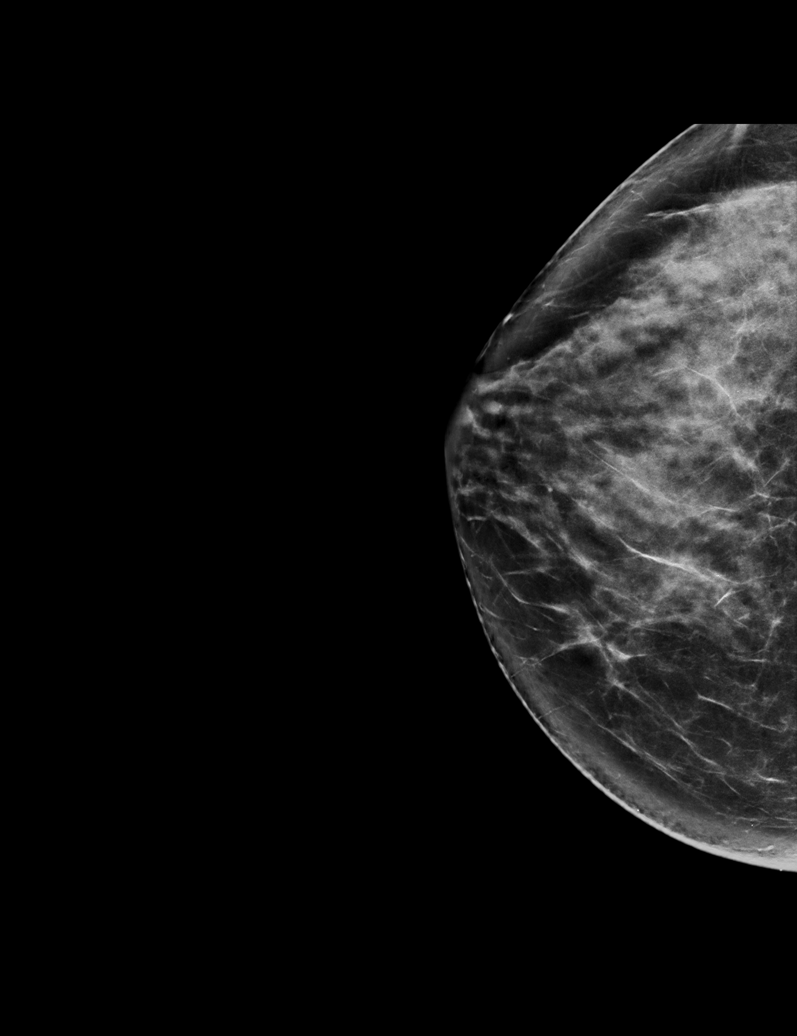

[L XCCL synth-2D]
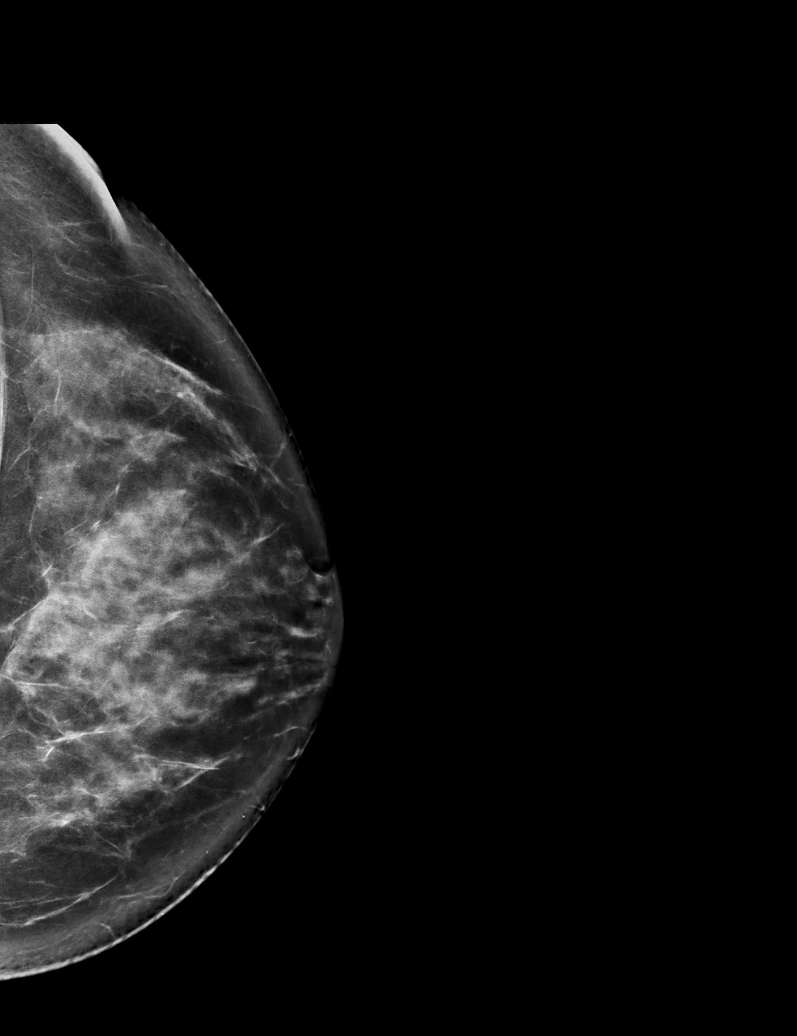

[R XCCL synth-2D]
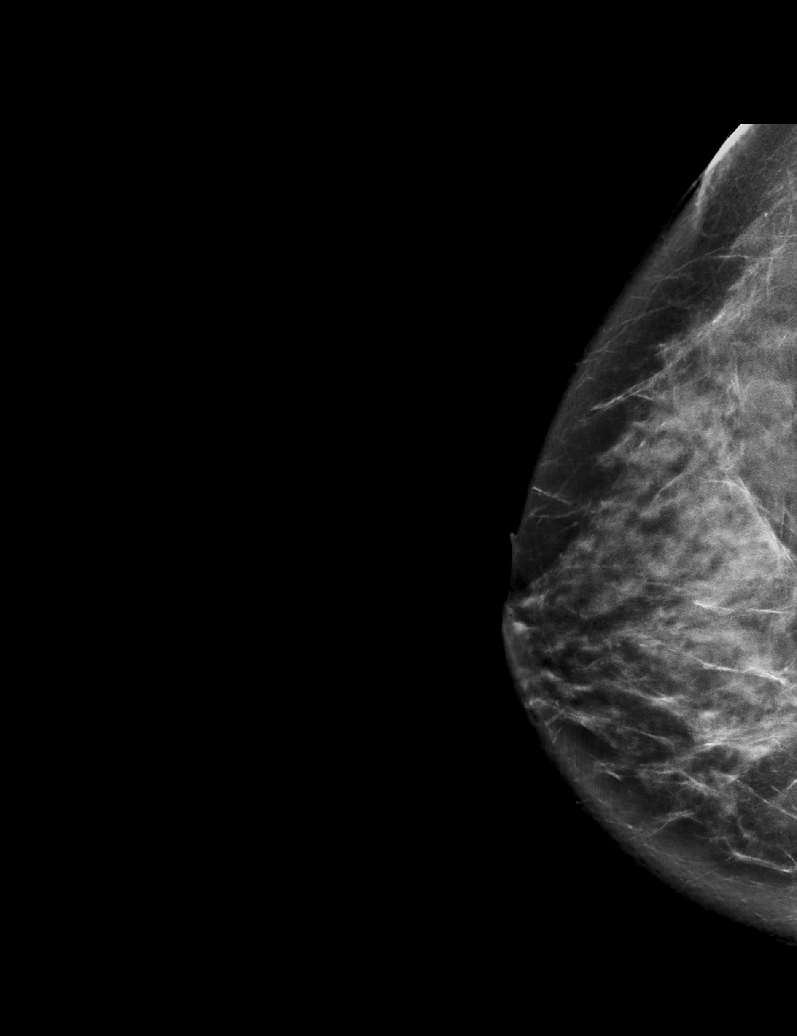

[L CC synth-2D]
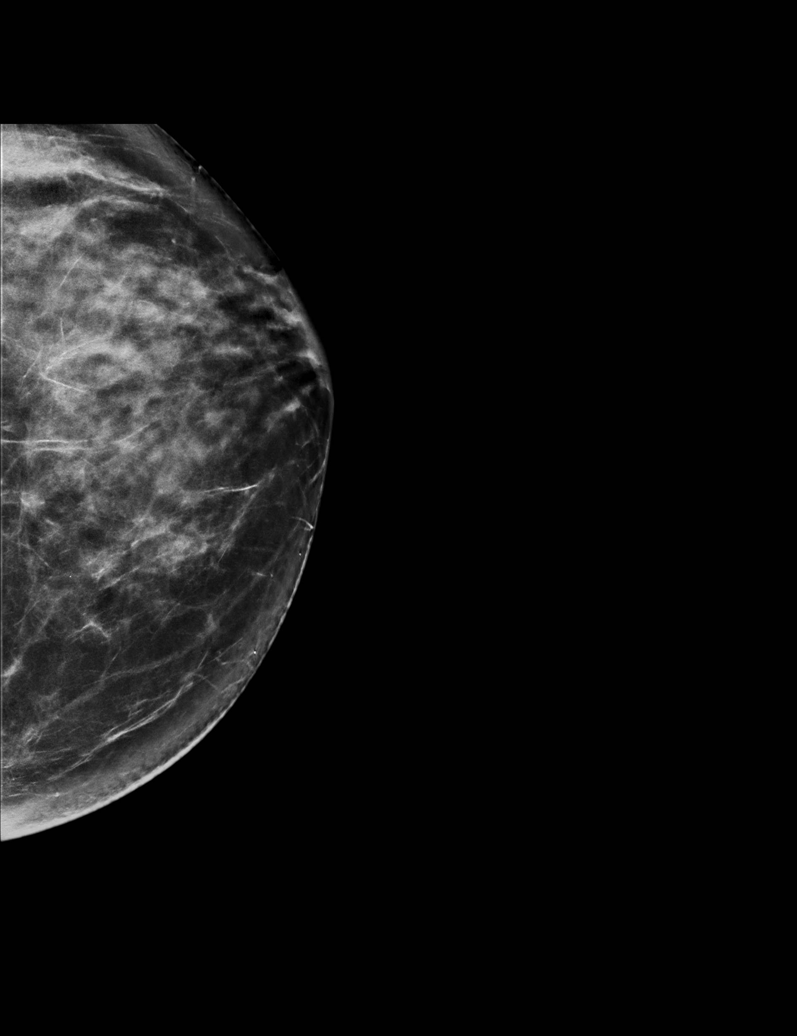

[L MLO synth-2D]
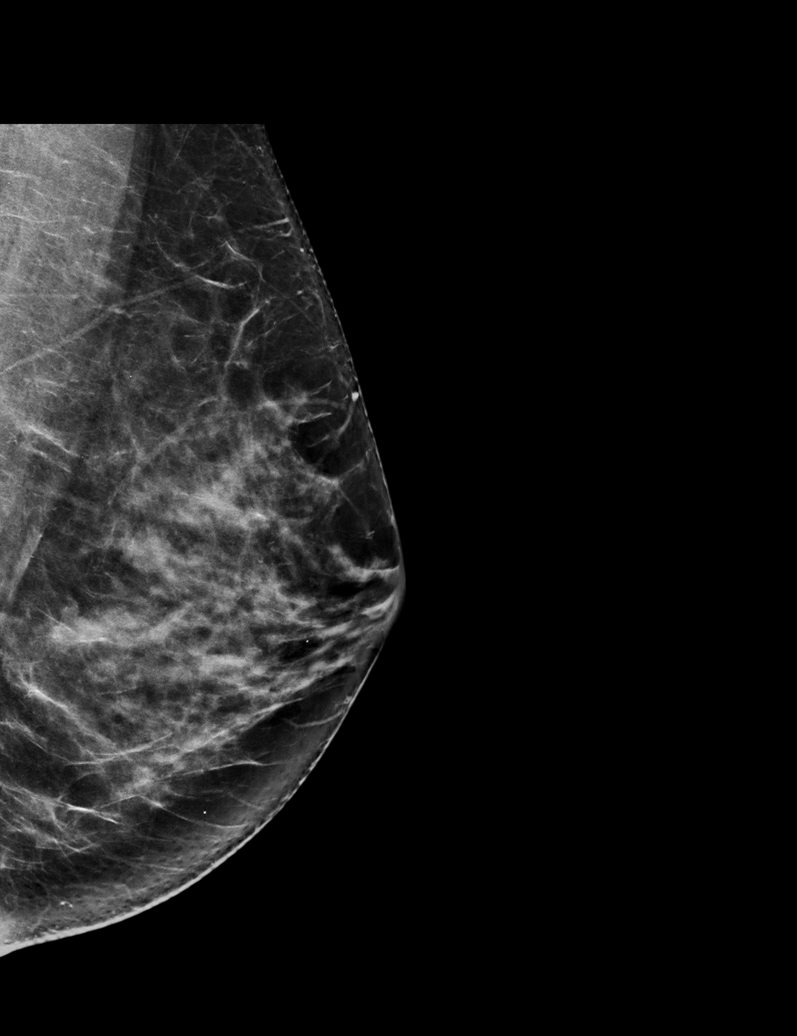

[R MLO synth-2D]
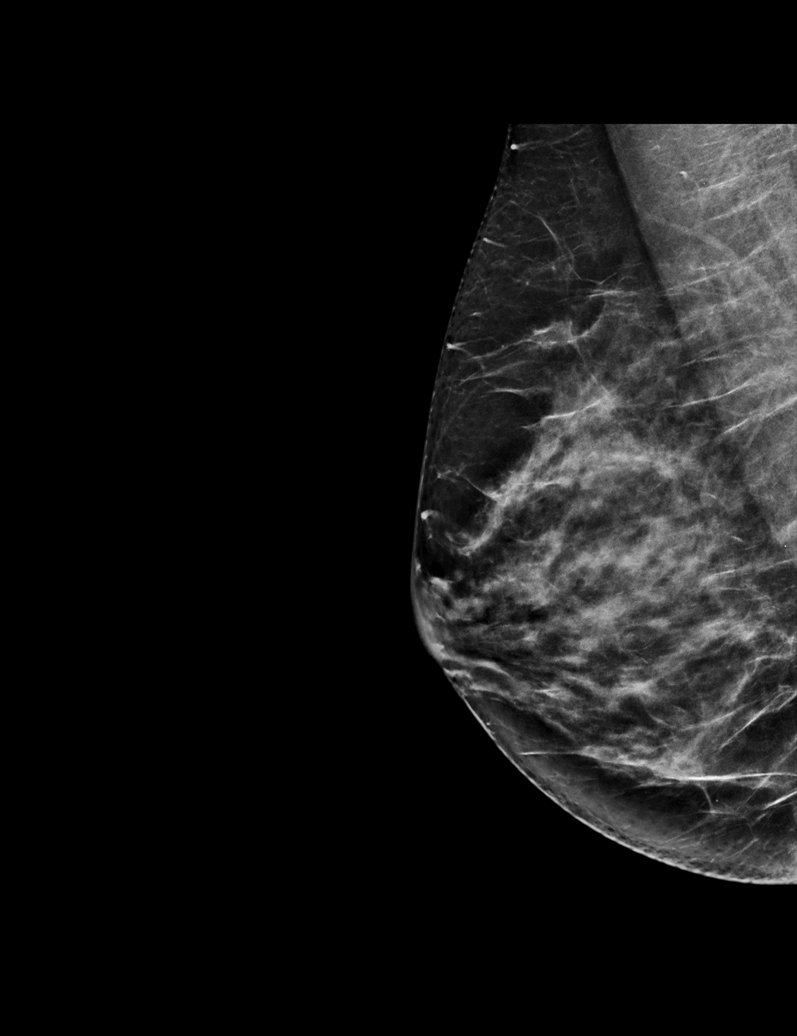

[6 of 36 positions shown; findings below may reference images not displayed]

ACR Breast Density Category c: The breast tissue is heterogeneously
dense, which may obscure small masses
FINDINGS: There are no findings suspicious for malignancy. Images were
processed with CAD.
IMPRESSION: No mammographic evidence of malignancy. A result letter of this
screening mammogram will be mailed directly to the patient.

RECOMMENDATION:
Screening mammogram in one year. (Code:EM-[id number])

BI-RADS CATEGORY  1: Negative.

## 2020-12-14 MED FILL — Zolmitriptan Tab 5 MG: ORAL | 10 days supply | Qty: 3 | Fill #1 | Status: AC

## 2020-12-15 ENCOUNTER — Other Ambulatory Visit (HOSPITAL_COMMUNITY): Payer: Self-pay

## 2020-12-16 ENCOUNTER — Other Ambulatory Visit (HOSPITAL_COMMUNITY): Payer: Self-pay

## 2020-12-23 ENCOUNTER — Other Ambulatory Visit: Payer: Self-pay

## 2020-12-23 DIAGNOSIS — G43829 Menstrual migraine, not intractable, without status migrainosus: Secondary | ICD-10-CM

## 2020-12-23 MED ORDER — ZOLMITRIPTAN 5 MG PO TABS: ORAL_TABLET | ORAL | 5 refills | Status: DC

## 2020-12-26 ENCOUNTER — Encounter: Payer: No Typology Code available for payment source | Admitting: Family Medicine

## 2021-01-06 ENCOUNTER — Encounter: Payer: Self-pay | Admitting: Family Medicine

## 2021-01-06 ENCOUNTER — Other Ambulatory Visit: Payer: Self-pay | Admitting: Family Medicine

## 2021-01-06 DIAGNOSIS — U071 COVID-19: Secondary | ICD-10-CM

## 2021-01-06 MED ORDER — NIRMATRELVIR/RITONAVIR (PAXLOVID)TABLET: 3.0000 | ORAL_TABLET | Freq: Two times a day (BID) | ORAL | 0 refills | Status: AC

## 2021-01-06 NOTE — Telephone Encounter (Signed)
No, I put it in.

## 2021-01-19 ENCOUNTER — Other Ambulatory Visit: Payer: Self-pay

## 2021-01-20 ENCOUNTER — Ambulatory Visit (INDEPENDENT_AMBULATORY_CARE_PROVIDER_SITE_OTHER): Payer: Managed Care, Other (non HMO) | Admitting: Family Medicine

## 2021-01-20 ENCOUNTER — Encounter: Payer: Self-pay | Admitting: Family Medicine

## 2021-01-20 ENCOUNTER — Other Ambulatory Visit (HOSPITAL_COMMUNITY)
Admission: RE | Admit: 2021-01-20 | Discharge: 2021-01-20 | Disposition: A | Discharge status: Home / Self Care | Payer: Managed Care, Other (non HMO) | Source: Ambulatory Visit | Attending: Family Medicine | Admitting: Family Medicine

## 2021-01-20 VITALS — BP 104/70 | HR 91 | Temp 99.0°F | Resp 18 | Ht 66.0 in | Wt 164.8 lb

## 2021-01-20 DIAGNOSIS — G43829 Menstrual migraine, not intractable, without status migrainosus: Secondary | ICD-10-CM | POA: Diagnosis not present

## 2021-01-20 DIAGNOSIS — Z0000 Encounter for general adult medical examination without abnormal findings: Secondary | ICD-10-CM | POA: Insufficient documentation

## 2021-01-20 LAB — LIPID PANEL
Cholesterol: 164 mg/dL (ref 0–200)
HDL: 57 mg/dL (ref >39.00)
LDL Cholesterol: 93 mg/dL (ref 0–99)
NonHDL: 106.83
Total CHOL/HDL Ratio: 3
Triglycerides: 71 mg/dL (ref 0.0–149.0)
VLDL: 14.2 mg/dL (ref 0.0–40.0)

## 2021-01-20 LAB — CBC WITH DIFFERENTIAL/PLATELET
Basophils Absolute: 0.1 10*3/uL (ref 0.0–0.1)
Basophils Relative: 1 % (ref 0.0–3.0)
Eosinophils Absolute: 0.1 10*3/uL (ref 0.0–0.7)
Eosinophils Relative: 2.2 % (ref 0.0–5.0)
HCT: 39.4 % (ref 36.0–46.0)
Hemoglobin: 13.2 g/dL (ref 12.0–15.0)
Lymphocytes Relative: 33.8 % (ref 12.0–46.0)
Lymphs Abs: 1.8 10*3/uL (ref 0.7–4.0)
MCHC: 33.5 g/dL (ref 30.0–36.0)
MCV: 90.1 fl (ref 78.0–100.0)
Monocytes Absolute: 0.4 10*3/uL (ref 0.1–1.0)
Monocytes Relative: 6.8 % (ref 3.0–12.0)
Neutro Abs: 3 10*3/uL (ref 1.4–7.7)
Neutrophils Relative %: 56.2 % (ref 43.0–77.0)
Platelets: 248 10*3/uL (ref 150.0–400.0)
RBC: 4.38 Mil/uL (ref 3.87–5.11)
RDW: 12.8 % (ref 11.5–15.5)
WBC: 5.2 10*3/uL (ref 4.0–10.5)

## 2021-01-20 LAB — COMPREHENSIVE METABOLIC PANEL
ALT: 13 U/L (ref 0–35)
AST: 14 U/L (ref 0–37)
Albumin: 4.4 g/dL (ref 3.5–5.2)
Alkaline Phosphatase: 61 U/L (ref 39–117)
BUN: 10 mg/dL (ref 6–23)
CO2: 28 mEq/L (ref 19–32)
Calcium: 9.7 mg/dL (ref 8.4–10.5)
Chloride: 102 mEq/L (ref 96–112)
Creatinine, Ser: 0.78 mg/dL (ref 0.40–1.20)
GFR: 93.71 mL/min (ref >60.00)
Glucose, Bld: 94 mg/dL (ref 70–99)
Potassium: 4.1 mEq/L (ref 3.5–5.1)
Sodium: 138 mEq/L (ref 135–145)
Total Bilirubin: 0.6 mg/dL (ref 0.2–1.2)
Total Protein: 7.2 g/dL (ref 6.0–8.3)

## 2021-01-20 LAB — TSH: TSH: 3.57 u[IU]/mL (ref 0.35–5.50)

## 2021-01-20 MED ORDER — ZOLMITRIPTAN 5 MG PO TABS: ORAL_TABLET | ORAL | 5 refills | Status: DC

## 2021-01-20 NOTE — Patient Instructions (Signed)
Preventive Care 42-42 Years Old, Female Preventive care refers to lifestyle choices and visits with your health care provider that can promote health and wellness. This includes: A yearly physical exam. This is also called an annual wellness visit. Regular dental and eye exams. Immunizations. Screening for certain conditions. Healthy lifestyle choices, such as: Eating a healthy diet. Getting regular exercise. Not using drugs or products that contain nicotine and tobacco. Limiting alcohol use. What can I expect for my preventive care visit? Physical exam Your health care provider will check your: Height and weight. These may be used to calculate your BMI (body mass index). BMI is a measurement that tells if you are at a healthy weight. Heart rate and blood pressure. Body temperature. Skin for abnormal spots. Counseling Your health care provider may ask you questions about your: Past medical problems. Family's medical history. Alcohol, tobacco, and drug use. Emotional well-being. Home life and relationship well-being. Sexual activity. Diet, exercise, and sleep habits. Work and work Statistician. Access to firearms. Method of birth control. Menstrual cycle. Pregnancy history. What immunizations do I need?  Vaccines are usually given at various ages, according to a schedule. Your health care provider will recommend vaccines for you based on your age, medicalhistory, and lifestyle or other factors, such as travel or where you work. What tests do I need? Blood tests Lipid and cholesterol levels. These may be checked every 5 years, or more often if you are over 42 years old. Hepatitis C test. Hepatitis B test. Screening Lung cancer screening. You may have this screening every year starting at age 42 if you have a 30-pack-year history of smoking and currently smoke or have quit within the past 15 years. Colorectal cancer screening. All adults should have this screening starting at  age 42 and continuing until age 42. Your health care provider may recommend screening at age 42 if you are at increased risk. You will have tests every 1-10 years, depending on your results and the type of screening test. Diabetes screening. This is done by checking your blood sugar (glucose) after you have not eaten for a while (fasting). You may have this done every 1-3 years. Mammogram. This may be done every 1-2 years. Talk with your health care provider about when you should start having regular mammograms. This may depend on whether you have a family history of breast cancer. BRCA-related cancer screening. This may be done if you have a family history of breast, ovarian, tubal, or peritoneal cancers. Pelvic exam and Pap test. This may be done every 3 years starting at age 42. Starting at age 42, this may be done every 5 years if you have a Pap test in combination with an HPV test. Other tests STD (sexually transmitted disease) testing, if you are at risk. Bone density scan. This is done to screen for osteoporosis. You may have this scan if you are at high risk for osteoporosis. Talk with your health care provider about your test results, treatment options,and if necessary, the need for more tests. Follow these instructions at home: Eating and drinking  Eat a diet that includes fresh fruits and vegetables, whole grains, lean protein, and low-fat dairy products. Take vitamin and mineral supplements as recommended by your health care provider. Do not drink alcohol if: Your health care provider tells you not to drink. You are pregnant, may be pregnant, or are planning to become pregnant. If you drink alcohol: Limit how much you have to 0-1 drink a day. Be aware  of how much alcohol is in your drink. In the [place] one drink equals one 12 oz bottle of beer (355 mL), one 5 oz glass of wine (148 mL), or one 1 oz glass of hard liquor (44 mL).  Lifestyle Take daily care of your teeth and  gums. Brush your teeth every morning and night with fluoride toothpaste. Floss one time each day. Stay active. Exercise for at least 30 minutes 5 or more days each week. Do not use any products that contain nicotine or tobacco, such as cigarettes, e-cigarettes, and chewing tobacco. If you need help quitting, ask your health care provider. Do not use drugs. If you are sexually active, practice safe sex. Use a condom or other form of protection to prevent STIs (sexually transmitted infections). If you do not wish to become pregnant, use a form of birth control. If you plan to become pregnant, see your health care provider for a prepregnancy visit. If told by your health care provider, take low-dose aspirin daily starting at age 42. Find healthy ways to cope with stress, such as: Meditation, yoga, or listening to music. Journaling. Talking to a trusted person. Spending time with friends and family. Safety Always wear your seat belt while driving or riding in a vehicle. Do not drive: If you have been drinking alcohol. Do not ride with someone who has been drinking. When you are tired or distracted. While texting. Wear a helmet and other protective equipment during sports activities. If you have firearms in your house, make sure you follow all gun safety procedures. What's next? Visit your health care provider once a year for an annual wellness visit. Ask your health care provider how often you should have your eyes and teeth checked. Stay up to date on all vaccines. This information is not intended to replace advice given to you by your health care provider. Make sure you discuss any questions you have with your healthcare provider. Document Revised: 03/11/2020 Document Reviewed: 02/16/2018 Elsevier Patient Education  2022 [employer].

## 2021-01-20 NOTE — Assessment & Plan Note (Signed)
Ghm utd Check labs  See avs 

## 2021-01-20 NOTE — Progress Notes (Signed)
Subjective:   By signing my name below, I, Shehryar Baig, attest that this documentation has been prepared under the direction and in the presence of Dr. Seabron Spates, DO. 01/20/2021      Patient ID: Amanda Wood, female    DOB: August 30, 1980, 40 y.o.   MRN: [id number]  Chief Complaint  Patient presents with   Annual Exam    Pt states fasting. Pap today     HPI Patient is in today for a comprehensive physical exam.  She continues taking 5 mg Zomig daily PO and is requesting a refill.  She continues taking OTC 1.25 mg vitamin D daily PO.  She denies having any fever, ear pain, congestion, sinus pain, sore throat, eye pain, chest pain, palpations, cough, new skin moles, SOB, wheezing, n/v/d, constipation, blood in stool, dysuria, frequency, hematuria, or headaches at this time. Her mother is diagnosed with high blood pressure and high cholesterol, otherwise there is no other changes to her family history.  She is UTD on vision care. She continues wearing contact lenses.  She reports having Covid-19 a couple of months ago and taking paxlovid to manage her symptoms.    Past Medical History:  Diagnosis Date   Lactose intolerance    Migraines    Sciatica    left leg    History reviewed. No pertinent surgical history.  Family History  Problem Relation Age of Onset   Hypertension Mother    Osteoarthritis Mother    Hyperlipidemia Mother    Hypertension Father    Osteoarthritis Father    Dementia Maternal Grandmother    Stroke Paternal Grandmother     Social History   Socioeconomic History   Marital status: Single    Spouse name: Not on file   Number of children: Not on file   Years of education: Not on file   Highest education level: Not on file  Occupational History   Occupation: physician    Employer: [employer]  Tobacco Use   Smoking status: Never   Smokeless tobacco: Never  Substance and Sexual Activity   Alcohol use: Yes    Comment: 1-2 glass of wine a  week   Drug use: No   Sexual activity: Yes    Partners: Male    Birth control/protection: Condom  Other Topics Concern   Not on file  Social History Narrative   Not on file   Social Determinants of Health   Financial Resource Strain: Not on file  Food Insecurity: Not on file  Transportation Needs: Not on file  Physical Activity: Not on file  Stress: Not on file  Social Connections: Not on file  Intimate Partner Violence: Not on file    Outpatient Medications Prior to Visit  Medication Sig Dispense Refill   Multiple Vitamins-Minerals (MULTIVITAMIN WITH MINERALS) tablet Take 1 tablet by mouth daily.     Vitamin D, Ergocalciferol, (DRISDOL) 1.25 MG (50000 UNIT) CAPS capsule Take 1 capsule (50,000 Units total) by mouth every 7 (seven) days. 12 capsule 0   zolmitriptan (ZOMIG) 5 MG tablet TAKE 1 TABLET BY MOUTH AS NEEDED FOR MIGRAINE 10 tablet 5   No facility-administered medications prior to visit.    No Known Allergies  Review of Systems  Constitutional:  Negative for fever.  HENT:  Negative for congestion, ear pain, sinus pain and sore throat.   Eyes:  Negative for pain.  Respiratory:  Negative for cough, shortness of breath and wheezing.   Cardiovascular:  Negative for chest pain  and palpitations.  Gastrointestinal:  Negative for blood in stool, constipation, diarrhea, nausea and vomiting.  Genitourinary:  Negative for dysuria, frequency and hematuria.  Skin:        (-)new moles  Neurological:  Negative for headaches.  Psychiatric/Behavioral:  Negative for depression. The patient is not nervous/anxious.       Objective:    Physical Exam Constitutional:      General: She is not in acute distress.    Appearance: Normal appearance. She is not ill-appearing.  HENT:     Head: Normocephalic and atraumatic.     Right Ear: Tympanic membrane, ear canal and external ear normal.     Left Ear: Tympanic membrane, ear canal and external ear normal.  Eyes:     Extraocular  Movements: Extraocular movements intact.     Pupils: Pupils are equal, round, and reactive to light.  Cardiovascular:     Rate and Rhythm: Normal rate and regular rhythm.     Heart sounds: Normal heart sounds. No murmur heard.   No gallop.  Pulmonary:     Effort: Pulmonary effort is normal. No respiratory distress.     Breath sounds: Normal breath sounds. No wheezing or rales.  Abdominal:     General: Bowel sounds are normal. There is no distension.     Palpations: Abdomen is soft. There is no mass.     Tenderness: There is no abdominal tenderness. There is no guarding or rebound.  Skin:    General: Skin is warm and dry.  Neurological:     Mental Status: She is alert and oriented to person, place, and time.  Psychiatric:        Behavior: Behavior normal.        Judgment: Judgment normal.    BP 104/70 (BP Location: Right Arm, Patient Position: Sitting, Cuff Size: Normal)   Pulse 91   Temp 99 F (37.2 C) (Oral)   Resp 18   Ht 5\' 6"  (1.676 m)   Wt 164 lb 12.8 oz (74.8 kg)   SpO2 97%   BMI 26.60 kg/m  Wt Readings from Last 3 Encounters:  01/20/21 164 lb 12.8 oz (74.8 kg)  12/21/19 170 lb 3.2 oz (77.2 kg)  11/13/19 168 lb (76.2 kg)    Diabetic Foot Exam - Simple   No data filed    Lab Results  Component Value Date   WBC 6.1 12/21/2019   HGB 13.6 12/21/2019   HCT 40.7 12/21/2019   PLT 215.0 12/21/2019   GLUCOSE 99 12/21/2019   CHOL 186 12/21/2019   TRIG 70.0 12/21/2019   HDL 70.00 12/21/2019   LDLCALC 102 (H) 12/21/2019   ALT 30 12/21/2019   AST 18 12/21/2019   NA 137 12/21/2019   K 4.2 12/21/2019   CL 102 12/21/2019   CREATININE 0.83 12/21/2019   BUN 12 12/21/2019   CO2 29 12/21/2019   TSH 3.55 12/21/2019    Lab Results  Component Value Date   TSH 3.55 12/21/2019   Lab Results  Component Value Date   WBC 6.1 12/21/2019   HGB 13.6 12/21/2019   HCT 40.7 12/21/2019   MCV 91.6 12/21/2019   PLT 215.0 12/21/2019   Lab Results  Component Value Date    NA 137 12/21/2019   K 4.2 12/21/2019   CO2 29 12/21/2019   GLUCOSE 99 12/21/2019   BUN 12 12/21/2019   CREATININE 0.83 12/21/2019   BILITOT 0.7 12/21/2019   ALKPHOS 55 12/21/2019   AST 18 12/21/2019  ALT 30 12/21/2019   PROT 7.1 12/21/2019   ALBUMIN 4.6 12/21/2019   CALCIUM 9.9 12/21/2019   GFR 75.65 12/21/2019   Lab Results  Component Value Date   CHOL 186 12/21/2019   Lab Results  Component Value Date   HDL 70.00 12/21/2019   Lab Results  Component Value Date   LDLCALC 102 (H) 12/21/2019   Lab Results  Component Value Date   TRIG 70.0 12/21/2019   Lab Results  Component Value Date   CHOLHDL 3 12/21/2019   No results found for: HGBA1C  Mammogram- Last completed 12/21/2019. Results normal. Repeat in 1 year. Due.  Pap smear- Last completed 12/02/2017. Results normal. Repeat in 3 years. Due.      Assessment & Plan:   Problem List Items Addressed This Visit       Unprioritized   Menstrual migraine   Relevant Medications   zolmitriptan (ZOMIG) 5 MG tablet   Preventative health care - Primary    Ghm utd Check labs  See avs        Relevant Orders   Cytology - PAP( [facility])   TSH   Lipid panel   CBC with Differential/Platelet   Comprehensive metabolic panel     Meds ordered this encounter  Medications   zolmitriptan (ZOMIG) 5 MG tablet    Sig: TAKE 1 TABLET BY MOUTH AS NEEDED FOR MIGRAINE    Dispense:  10 tablet    Refill:  5    I, Dr. Seabron Spates, DO, personally preformed the services described in this documentation.  All medical record entries made by the scribe were at my direction and in my presence.  I have reviewed the chart and discharge instructions (if applicable) and agree that the record reflects my personal performance and is accurate and complete. 01/20/2021   I,Shehryar Baig,acting as a scribe for Donato Schultz, DO.,have documented all relevant documentation on the behalf of Donato Schultz, DO,as directed by   Donato Schultz, DO while in the presence of Donato Schultz, DO.   Donato Schultz, DO

## 2021-01-21 LAB — CYTOLOGY - PAP: Diagnosis: NEGATIVE

## 2021-04-17 ENCOUNTER — Encounter: Payer: Self-pay | Admitting: Family Medicine

## 2021-04-20 ENCOUNTER — Other Ambulatory Visit: Payer: Self-pay | Admitting: Family Medicine

## 2021-04-20 ENCOUNTER — Encounter: Payer: Self-pay | Admitting: Family Medicine

## 2021-04-20 DIAGNOSIS — G43829 Menstrual migraine, not intractable, without status migrainosus: Secondary | ICD-10-CM

## 2021-04-20 MED ORDER — ZOLMITRIPTAN 5 MG PO TABS: ORAL_TABLET | ORAL | 0 refills | Status: DC

## 2021-04-20 MED ORDER — ZOLMITRIPTAN 5 MG PO TABS: ORAL_TABLET | ORAL | 3 refills | Status: DC

## 2021-04-21 NOTE — Telephone Encounter (Signed)
noted 

## 2021-08-06 NOTE — Progress Notes (Signed)
°   Amanda Wood D.[facility] [address] [place] [address] Phone: [phone]   Assessment and Plan:     1. Acute left-sided low back pain without sciatica 2. Somatic dysfunction of thoracic region 3. Somatic dysfunction of lumbar region 4. Somatic dysfunction of pelvic region -Acute, uncomplicated, initial sports medicine visit - Acute flare of back pain at upper lumbar region likely representing an acute strain - Patient has received significant relief with OMT in the past.  Elects for repeat OMT today.  Tolerated well per note below. - Decision today to treat with OMT was based on Physical Exam -Tylenol/NSAIDs as needed for pain control  After verbal consent patient was treated with HVLA (high velocity low amplitude), ME (muscle energy), FPR (flex positional release), ST (soft tissue), PC/PD (Pelvic Compression/ Pelvic Decompression) techniques in thoracic, lumbar, and pelvic areas. Patient tolerated the procedure well with improvement in symptoms.  Patient educated on potential side effects of soreness and recommended to rest, hydrate, and use Tylenol as needed for pain control.   Pertinent previous records reviewed include none   Follow Up: As needed if no improvement or worsening of symptoms   Subjective:   I, Amanda Wood, am serving as a Neurosurgeon for Doctor Richardean Sale  Chief Complaint: rib pain   HPI:  08/07/2021 Patient is a 41 year old female complaining of rib pain. Patient states that she was putting  her coat  and some paper turned to put it in her car seat and she felt a shooting pain in the thoracic area that radiates down to her low back , no numbness or tingling took IB and that seemed to helped used and NSAID patch that did nothing for her pain , she does not remember an MOI but it could have been from yoga and pump class on Wednesday .   Relevant Historical Information: None pertinent  Additional pertinent review of systems  negative.  Current Outpatient Medications  Medication Sig Dispense Refill   Multiple Vitamins-Minerals (MULTIVITAMIN WITH MINERALS) tablet Take 1 tablet by mouth daily.     zolmitriptan (ZOMIG) 5 MG tablet TAKE 1 TABLET BY MOUTH AS NEEDED FOR MIGRAINE 10 tablet 0   No current facility-administered medications for this visit.      Objective:     Vitals:   08/07/21 0853  BP: 120/80  Pulse: 83  SpO2: 98%  Weight: 169 lb (76.7 kg)  Height: 5\' 6"  (1.676 m)      Body mass index is 27.28 kg/m.    Physical Exam:     General: Well-appearing, cooperative, sitting comfortably in no acute distress.   OMT Physical Exam:  ASIS Compression Test: Positive left   Thoracic: nTTP paraspinal, T9-11 RLSR Lumbar: TTP paraspinal on right, L1-3 RRSL, L5 rotated left Pelvis: Left anterior innominate  Electronically signed by:  D.[facility] 12:20 PM 08/07/21

## 2021-08-07 ENCOUNTER — Other Ambulatory Visit: Payer: Self-pay

## 2021-08-07 ENCOUNTER — Ambulatory Visit (INDEPENDENT_AMBULATORY_CARE_PROVIDER_SITE_OTHER): Payer: Managed Care, Other (non HMO) | Admitting: Sports Medicine

## 2021-08-07 VITALS — BP 120/80 | HR 83 | Ht 66.0 in | Wt 169.0 lb

## 2021-08-07 DIAGNOSIS — M9903 Segmental and somatic dysfunction of lumbar region: Secondary | ICD-10-CM

## 2021-08-07 DIAGNOSIS — M9902 Segmental and somatic dysfunction of thoracic region: Secondary | ICD-10-CM | POA: Diagnosis not present

## 2021-08-07 DIAGNOSIS — M9905 Segmental and somatic dysfunction of pelvic region: Secondary | ICD-10-CM | POA: Diagnosis not present

## 2021-08-07 DIAGNOSIS — M545 Low back pain, unspecified: Secondary | ICD-10-CM | POA: Diagnosis not present

## 2021-08-07 NOTE — Patient Instructions (Addendum)
Good to see you  ?As needed follow up  ?

## 2021-11-16 ENCOUNTER — Telehealth: Payer: Self-pay | Admitting: Family Medicine

## 2021-11-16 DIAGNOSIS — G43829 Menstrual migraine, not intractable, without status migrainosus: Secondary | ICD-10-CM

## 2021-11-17 ENCOUNTER — Other Ambulatory Visit (HOSPITAL_COMMUNITY): Payer: Self-pay

## 2021-11-17 MED ORDER — ZOLMITRIPTAN 5 MG PO TABS
ORAL_TABLET | ORAL | 0 refills | Status: DC
  Filled 2021-11-17: qty 2, 1d supply, fill #0
  Filled 2021-11-17: qty 8, 29d supply, fill #0

## 2021-11-20 MED ORDER — ZOLMITRIPTAN 5 MG PO TABS: ORAL_TABLET | ORAL | 0 refills | Status: DC

## 2021-11-20 NOTE — Telephone Encounter (Signed)
[facility] pharmacy called stating the patient wanted the zolmitriptan sent to them not [place]. Please send medication to [facility]. Please advise.

## 2021-11-20 NOTE — Addendum Note (Signed)
Addended by: Roxanne Gates on: 11/20/2021 02:11 PM   Modules accepted: Orders

## 2021-11-20 NOTE — Telephone Encounter (Signed)
Rx sent to [facility] 

## 2021-11-23 ENCOUNTER — Other Ambulatory Visit (HOSPITAL_COMMUNITY): Payer: Self-pay

## 2021-12-17 ENCOUNTER — Other Ambulatory Visit: Payer: Self-pay | Admitting: Family Medicine

## 2021-12-17 DIAGNOSIS — G43829 Menstrual migraine, not intractable, without status migrainosus: Secondary | ICD-10-CM

## 2022-01-11 ENCOUNTER — Other Ambulatory Visit: Payer: Self-pay | Admitting: Family Medicine

## 2022-01-11 DIAGNOSIS — G43829 Menstrual migraine, not intractable, without status migrainosus: Secondary | ICD-10-CM

## 2022-01-22 ENCOUNTER — Other Ambulatory Visit (HOSPITAL_BASED_OUTPATIENT_CLINIC_OR_DEPARTMENT_OTHER): Payer: Self-pay | Admitting: Family Medicine

## 2022-01-22 ENCOUNTER — Ambulatory Visit (INDEPENDENT_AMBULATORY_CARE_PROVIDER_SITE_OTHER): Payer: BC Managed Care – PPO | Admitting: Family Medicine

## 2022-01-22 ENCOUNTER — Encounter: Payer: Self-pay | Admitting: Family Medicine

## 2022-01-22 VITALS — BP 130/88 | HR 74 | Temp 98.0°F | Resp 18 | Ht 66.0 in | Wt 171.0 lb

## 2022-01-22 DIAGNOSIS — G43829 Menstrual migraine, not intractable, without status migrainosus: Secondary | ICD-10-CM

## 2022-01-22 DIAGNOSIS — Z309 Encounter for contraceptive management, unspecified: Secondary | ICD-10-CM | POA: Diagnosis not present

## 2022-01-22 DIAGNOSIS — Z0000 Encounter for general adult medical examination without abnormal findings: Secondary | ICD-10-CM

## 2022-01-22 DIAGNOSIS — Z1231 Encounter for screening mammogram for malignant neoplasm of breast: Secondary | ICD-10-CM

## 2022-01-22 LAB — CBC WITH DIFFERENTIAL/PLATELET
Basophils Absolute: 0.1 10*3/uL (ref 0.0–0.1)
Basophils Relative: 0.7 % (ref 0.0–3.0)
Eosinophils Absolute: 0.2 10*3/uL (ref 0.0–0.7)
Eosinophils Relative: 2.3 % (ref 0.0–5.0)
HCT: 40.4 % (ref 36.0–46.0)
Hemoglobin: 13.5 g/dL (ref 12.0–15.0)
Lymphocytes Relative: 26.7 % (ref 12.0–46.0)
Lymphs Abs: 1.8 10*3/uL (ref 0.7–4.0)
MCHC: 33.5 g/dL (ref 30.0–36.0)
MCV: 91.3 fl (ref 78.0–100.0)
Monocytes Absolute: 0.5 10*3/uL (ref 0.1–1.0)
Monocytes Relative: 6.8 % (ref 3.0–12.0)
Neutro Abs: 4.4 10*3/uL (ref 1.4–7.7)
Neutrophils Relative %: 63.5 % (ref 43.0–77.0)
Platelets: 254 10*3/uL (ref 150.0–400.0)
RBC: 4.43 Mil/uL (ref 3.87–5.11)
RDW: 13.1 % (ref 11.5–15.5)
WBC: 6.9 10*3/uL (ref 4.0–10.5)

## 2022-01-22 LAB — LIPID PANEL
Cholesterol: 163 mg/dL (ref 0–200)
HDL: 61.1 mg/dL (ref >39.00)
LDL Cholesterol: 88 mg/dL (ref 0–99)
NonHDL: 102.22
Total CHOL/HDL Ratio: 3
Triglycerides: 69 mg/dL (ref 0.0–149.0)
VLDL: 13.8 mg/dL (ref 0.0–40.0)

## 2022-01-22 LAB — COMPREHENSIVE METABOLIC PANEL
ALT: 11 U/L (ref 0–35)
AST: 12 U/L (ref 0–37)
Albumin: 4.7 g/dL (ref 3.5–5.2)
Alkaline Phosphatase: 58 U/L (ref 39–117)
BUN: 13 mg/dL (ref 6–23)
CO2: 27 mEq/L (ref 19–32)
Calcium: 10.1 mg/dL (ref 8.4–10.5)
Chloride: 103 mEq/L (ref 96–112)
Creatinine, Ser: 0.86 mg/dL (ref 0.40–1.20)
GFR: 82.77 mL/min (ref >60.00)
Glucose, Bld: 96 mg/dL (ref 70–99)
Potassium: 4.5 mEq/L (ref 3.5–5.1)
Sodium: 139 mEq/L (ref 135–145)
Total Bilirubin: 0.7 mg/dL (ref 0.2–1.2)
Total Protein: 7.2 g/dL (ref 6.0–8.3)

## 2022-01-22 LAB — TSH: TSH: 2.37 u[IU]/mL (ref 0.35–5.50)

## 2022-01-22 NOTE — Assessment & Plan Note (Signed)
Controlled  Prn zomig

## 2022-01-22 NOTE — Assessment & Plan Note (Signed)
Refer to gyn ---- ? IUD

## 2022-01-22 NOTE — Progress Notes (Signed)
Subjective:   By signing my name below, I, Luiz Ochoa, attest that this documentation has been prepared under the direction and in the presence of Ann Held, DO 01/22/2022    Patient ID: Amanda Wood, female    DOB: Apr 01, 1981, 41 y.o.   MRN: [id number]  Chief Complaint  Patient presents with   Annual Exam    Pt states fasting     HPI Patient is in today for comprehensive physical.  Shew states that she has occasional migraines which come in "spurts." Sometimes she will have them for a short moment, and other times they will last multiple days. She is remains compliant with the Zomig for relief.   She is unsure if she should get an IUD for birth control, because birth control pills worsen her migraines. She is interested in having something that is non-hormonal.   She was concerned about having a benign mole on the right side of her face removed.  She denies having any fever, new moles, congestion, sinus pain, sore throat, chest pain, palpitations, cough, shortness of breath, wheezing, nausea, vomiting, diarrhea, constipation, dysuria, frequency, abdominal pain, hematuria, new muscle pain, and new joint pain.   For exercise she continues to run and do yoga.   No changes in family history. She is interested in having her mammogram completed.   Past Medical History:  Diagnosis Date   Lactose intolerance    Migraines    Sciatica    left leg    History reviewed. No pertinent surgical history.  Family History  Problem Relation Age of Onset   Hypertension Mother    Osteoarthritis Mother    Hyperlipidemia Mother    Hypertension Father    Osteoarthritis Father    Dementia Maternal Grandmother    Stroke Paternal Grandmother     Social History   Socioeconomic History   Marital status: Single    Spouse name: Not on file   Number of children: Not on file   Years of education: Not on file   Highest education level: Not on file  Occupational History    Occupation: physician    Employer: [employer]  Tobacco Use   Smoking status: Never   Smokeless tobacco: Never  Substance and Sexual Activity   Alcohol use: Yes    Comment: 1-2 glass of wine a week   Drug use: No   Sexual activity: Yes    Partners: Male    Birth control/protection: Condom  Other Topics Concern   Not on file  Social History Narrative   Not on file   Social Determinants of Health   Financial Resource Strain: Not on file  Food Insecurity: Not on file  Transportation Needs: Not on file  Physical Activity: Not on file  Stress: Not on file  Social Connections: Not on file  Intimate Partner Violence: Not on file    Outpatient Medications Prior to Visit  Medication Sig Dispense Refill   Multiple Vitamins-Minerals (MULTIVITAMIN WITH MINERALS) tablet Take 1 tablet by mouth daily.     zolmitriptan (ZOMIG) 5 MG tablet Take 1 tablet by mouth as needed for migraine. 10 tablet 3   No facility-administered medications prior to visit.    No Known Allergies  Review of Systems  Constitutional:  Negative for fever.  HENT:  Negative for congestion, sinus pain and sore throat.   Respiratory:  Negative for cough, shortness of breath and wheezing.   Cardiovascular:  Negative for chest pain and palpitations.  Gastrointestinal:  Negative for abdominal pain, constipation, diarrhea, nausea and vomiting.  Genitourinary:  Negative for dysuria, frequency and hematuria.  Musculoskeletal:  Negative for joint pain and myalgias.  Skin:        (-) New Moles  Neurological:  Positive for headaches (occasional migraines).       Objective:    Physical Exam Constitutional:      Appearance: Normal appearance. She is not ill-appearing.  HENT:     Head: Normocephalic and atraumatic.     Right Ear: Tympanic membrane, ear canal and external ear normal.     Left Ear: Tympanic membrane, ear canal and external ear normal.  Eyes:     Extraocular Movements: Extraocular movements intact.      Pupils: Pupils are equal, round, and reactive to light.  Cardiovascular:     Rate and Rhythm: Normal rate and regular rhythm.     Pulses: Normal pulses.     Heart sounds: Normal heart sounds. No murmur heard.    No gallop.  Pulmonary:     Effort: Pulmonary effort is normal. No respiratory distress.     Breath sounds: Normal breath sounds. No wheezing or rales.  Abdominal:     General: Bowel sounds are normal. There is no distension.     Palpations: Abdomen is soft.     Tenderness: There is no abdominal tenderness. There is no guarding.  Skin:    General: Skin is warm and dry.  Neurological:     Mental Status: She is alert and oriented to person, place, and time.  Psychiatric:        Judgment: Judgment normal.     BP 130/88 (BP Location: Left Arm, Patient Position: Sitting, Cuff Size: Normal)   Pulse 74   Temp 98 F (36.7 C) (Oral)   Resp 18   Ht 5\' 6"  (1.676 m)   Wt 171 lb (77.6 kg)   SpO2 99%   BMI 27.60 kg/m  Wt Readings from Last 3 Encounters:  01/22/22 171 lb (77.6 kg)  08/07/21 169 lb (76.7 kg)  01/20/21 164 lb 12.8 oz (74.8 kg)    Diabetic Foot Exam - Simple   No data filed    Lab Results  Component Value Date   WBC 5.2 01/20/2021   HGB 13.2 01/20/2021   HCT 39.4 01/20/2021   PLT 248.0 01/20/2021   GLUCOSE 94 01/20/2021   CHOL 164 01/20/2021   TRIG 71.0 01/20/2021   HDL 57.00 01/20/2021   LDLCALC 93 01/20/2021   ALT 13 01/20/2021   AST 14 01/20/2021   NA 138 01/20/2021   K 4.1 01/20/2021   CL 102 01/20/2021   CREATININE 0.78 01/20/2021   BUN 10 01/20/2021   CO2 28 01/20/2021   TSH 3.57 01/20/2021    Lab Results  Component Value Date   TSH 3.57 01/20/2021   Lab Results  Component Value Date   WBC 5.2 01/20/2021   HGB 13.2 01/20/2021   HCT 39.4 01/20/2021   MCV 90.1 01/20/2021   PLT 248.0 01/20/2021   Lab Results  Component Value Date   NA 138 01/20/2021   K 4.1 01/20/2021   CO2 28 01/20/2021   GLUCOSE 94 01/20/2021   BUN 10  01/20/2021   CREATININE 0.78 01/20/2021   BILITOT 0.6 01/20/2021   ALKPHOS 61 01/20/2021   AST 14 01/20/2021   ALT 13 01/20/2021   PROT 7.2 01/20/2021   ALBUMIN 4.4 01/20/2021   CALCIUM 9.7 01/20/2021   GFR 93.71 01/20/2021  Lab Results  Component Value Date   CHOL 164 01/20/2021   Lab Results  Component Value Date   HDL 57.00 01/20/2021   Lab Results  Component Value Date   LDLCALC 93 01/20/2021   Lab Results  Component Value Date   TRIG 71.0 01/20/2021   Lab Results  Component Value Date   CHOLHDL 3 01/20/2021   No results found for: "HGBA1C"      Pap Smear- Last completed 01/20/2021. Results:  Adequacy Satisfactory for evaluation; transformation zone component PRESENT.   Diagnosis - Negative for intraepithelial lesion or malignancy (NILM)    Follow up in 3 years.  Mammogram- Last completed 12/21/2019. Results are normal. Follow up in 1 year.   Assessment & Plan:   Problem List Items Addressed This Visit       Unprioritized   Preventative health care - Primary    ghm utd Check labs  See avs       Relevant Orders   CBC with Differential/Platelet   Comprehensive metabolic panel   Lipid panel   TSH   Menstrual migraine    Controlled  Prn zomig       Encounter for contraceptive management    Refer to gyn ---- ? IUD       Relevant Orders   Ambulatory referral to Obstetrics / Gynecology     No orders of the defined types were placed in this encounter.   IDonato Schultz, DO, personally preformed the services described in this documentation.  All medical record entries made by the scribe were at my direction and in my presence.  I have reviewed the chart and discharge instructions (if applicable) and agree that the record reflects my personal performance and is accurate and complete. 01/22/2022   I,Tinashe Williams,acting as a scribe for Donato Schultz, DO.,have documented all relevant documentation on the behalf of Donato Schultz, DO,as directed by  Donato Schultz, DO while in the presence of Donato Schultz, DO.    Donato Schultz, DO

## 2022-01-22 NOTE — Assessment & Plan Note (Signed)
ghm utd Check labs  See avs  

## 2022-01-22 NOTE — Patient Instructions (Signed)
Preventive Care 41 Years Old, Female Preventive care refers to lifestyle choices and visits with your health care provider that can promote health and wellness. Preventive care visits are also called wellness exams. What can I expect for my preventive care visit? Counseling Your health care provider may ask you questions about your: Medical history, including: Past medical problems. Family medical history. Pregnancy history. Current health, including: Menstrual cycle. Method of birth control. Emotional well-being. Home life and relationship well-being. Sexual activity and sexual health. Lifestyle, including: Alcohol, nicotine or tobacco, and drug use. Access to firearms. Diet, exercise, and sleep habits. Work and work environment. Sunscreen use. Safety issues such as seatbelt and bike helmet use. Physical exam Your health care provider will check your: Height and weight. These may be used to calculate your BMI (body mass index). BMI is a measurement that tells if you are at a healthy weight. Waist circumference. This measures the distance around your waistline. This measurement also tells if you are at a healthy weight and may help predict your risk of certain diseases, such as type 2 diabetes and high blood pressure. Heart rate and blood pressure. Body temperature. Skin for abnormal spots. What immunizations do I need?  Vaccines are usually given at various ages, according to a schedule. Your health care provider will recommend vaccines for you based on your age, medical history, and lifestyle or other factors, such as travel or where you work. What tests do I need? Screening Your health care provider may recommend screening tests for certain conditions. This may include: Lipid and cholesterol levels. Diabetes screening. This is done by checking your blood sugar (glucose) after you have not eaten for a while (fasting). Pelvic exam and Pap test. Hepatitis B test. Hepatitis C  test. HIV (human immunodeficiency virus) test. STI (sexually transmitted infection) testing, if you are at risk. Lung cancer screening. Colorectal cancer screening. Mammogram. Talk with your health care provider about when you should start having regular mammograms. This may depend on whether you have a family history of breast cancer. BRCA-related cancer screening. This may be done if you have a family history of breast, ovarian, tubal, or peritoneal cancers. Bone density scan. This is done to screen for osteoporosis. Talk with your health care provider about your test results, treatment options, and if necessary, the need for more tests. Follow these instructions at home: Eating and drinking  Eat a diet that includes fresh fruits and vegetables, whole grains, lean protein, and low-fat dairy products. Take vitamin and mineral supplements as recommended by your health care provider. Do not drink alcohol if: Your health care provider tells you not to drink. You are pregnant, may be pregnant, or are planning to become pregnant. If you drink alcohol: Limit how much you have to 0-1 drink a day. Know how much alcohol is in your drink. In the [place] one drink equals one 12 oz bottle of beer (355 mL), one 5 oz glass of wine (148 mL), or one 1 oz glass of hard liquor (44 mL). Lifestyle Brush your teeth every morning and night with fluoride toothpaste. Floss one time each day. Exercise for at least 30 minutes 5 or more days each week. Do not use any products that contain nicotine or tobacco. These products include cigarettes, chewing tobacco, and vaping devices, such as e-cigarettes. If you need help quitting, ask your health care provider. Do not use drugs. If you are sexually active, practice safe sex. Use a condom or other form of protection to   prevent STIs. If you do not wish to become pregnant, use a form of birth control. If you plan to become pregnant, see your health care provider for a  prepregnancy visit. Take aspirin only as told by your health care provider. Make sure that you understand how much to take and what form to take. Work with your health care provider to find out whether it is safe and beneficial for you to take aspirin daily. Find healthy ways to manage stress, such as: Meditation, yoga, or listening to music. Journaling. Talking to a trusted person. Spending time with friends and family. Minimize exposure to UV radiation to reduce your risk of skin cancer. Safety Always wear your seat belt while driving or riding in a vehicle. Do not drive: If you have been drinking alcohol. Do not ride with someone who has been drinking. When you are tired or distracted. While texting. If you have been using any mind-altering substances or drugs. Wear a helmet and other protective equipment during sports activities. If you have firearms in your house, make sure you follow all gun safety procedures. Seek help if you have been physically or sexually abused. What's next? Visit your health care provider once a year for an annual wellness visit. Ask your health care provider how often you should have your eyes and teeth checked. Stay up to date on all vaccines. This information is not intended to replace advice given to you by your health care provider. Make sure you discuss any questions you have with your health care provider. Document Revised: 12/03/2020 Document Reviewed: 12/03/2020 Elsevier Patient Education  [place].

## 2022-01-27 ENCOUNTER — Encounter (HOSPITAL_BASED_OUTPATIENT_CLINIC_OR_DEPARTMENT_OTHER): Payer: Self-pay

## 2022-01-27 ENCOUNTER — Ambulatory Visit (HOSPITAL_BASED_OUTPATIENT_CLINIC_OR_DEPARTMENT_OTHER)
Admission: RE | Admit: 2022-01-27 | Discharge: 2022-01-27 | Disposition: A | Discharge status: Home / Self Care | Payer: BC Managed Care – PPO | Source: Ambulatory Visit | Attending: Family Medicine | Admitting: Family Medicine

## 2022-01-27 DIAGNOSIS — Z1231 Encounter for screening mammogram for malignant neoplasm of breast: Secondary | ICD-10-CM | POA: Insufficient documentation

## 2022-03-01 DIAGNOSIS — G43909 Migraine, unspecified, not intractable, without status migrainosus: Secondary | ICD-10-CM | POA: Diagnosis not present

## 2022-03-01 DIAGNOSIS — Z309 Encounter for contraceptive management, unspecified: Secondary | ICD-10-CM | POA: Diagnosis not present

## 2022-04-01 DIAGNOSIS — Z113 Encounter for screening for infections with a predominantly sexual mode of transmission: Secondary | ICD-10-CM | POA: Diagnosis not present

## 2022-04-01 DIAGNOSIS — Z3202 Encounter for pregnancy test, result negative: Secondary | ICD-10-CM | POA: Diagnosis not present

## 2022-04-01 DIAGNOSIS — Z30430 Encounter for insertion of intrauterine contraceptive device: Secondary | ICD-10-CM | POA: Diagnosis not present

## 2022-04-02 DIAGNOSIS — Z23 Encounter for immunization: Secondary | ICD-10-CM | POA: Diagnosis not present

## 2022-04-26 ENCOUNTER — Telehealth: Payer: Self-pay | Admitting: Family Medicine

## 2022-04-26 NOTE — Telephone Encounter (Signed)
Pt dropped off document to be filled out by provider (1 page [employer] health program). Pt would like to be called when document ready for pick up at [facility] [phone]. Document put at front office tray under providers name.

## 2022-04-28 NOTE — Telephone Encounter (Signed)
Received. Placed in folders

## 2022-05-03 NOTE — Telephone Encounter (Signed)
Left message on machine that paperwork is filled out and ready for pick up. Paperwork placed up front.

## 2022-06-11 ENCOUNTER — Other Ambulatory Visit: Payer: Self-pay | Admitting: Family Medicine

## 2022-06-11 DIAGNOSIS — G43829 Menstrual migraine, not intractable, without status migrainosus: Secondary | ICD-10-CM

## 2022-07-28 ENCOUNTER — Other Ambulatory Visit: Payer: Self-pay | Admitting: Family Medicine

## 2022-07-28 DIAGNOSIS — G43829 Menstrual migraine, not intractable, without status migrainosus: Secondary | ICD-10-CM

## 2022-09-01 DIAGNOSIS — H04123 Dry eye syndrome of bilateral lacrimal glands: Secondary | ICD-10-CM | POA: Diagnosis not present

## 2022-09-01 DIAGNOSIS — H5213 Myopia, bilateral: Secondary | ICD-10-CM | POA: Diagnosis not present

## 2023-01-27 ENCOUNTER — Encounter: Payer: Self-pay | Admitting: Family Medicine

## 2023-01-28 ENCOUNTER — Encounter: Payer: Self-pay | Admitting: Family Medicine

## 2023-01-28 ENCOUNTER — Ambulatory Visit (INDEPENDENT_AMBULATORY_CARE_PROVIDER_SITE_OTHER): Payer: BC Managed Care – PPO | Admitting: Family Medicine

## 2023-01-28 VITALS — BP 112/80 | HR 79 | Temp 98.6°F | Resp 18 | Ht 66.0 in | Wt 176.6 lb

## 2023-01-28 DIAGNOSIS — G43829 Menstrual migraine, not intractable, without status migrainosus: Secondary | ICD-10-CM | POA: Diagnosis not present

## 2023-01-28 DIAGNOSIS — Z0000 Encounter for general adult medical examination without abnormal findings: Secondary | ICD-10-CM | POA: Diagnosis not present

## 2023-01-28 LAB — COMPREHENSIVE METABOLIC PANEL
ALT: 10 U/L (ref 0–35)
AST: 11 U/L (ref 0–37)
Albumin: 4.7 g/dL (ref 3.5–5.2)
Alkaline Phosphatase: 54 U/L (ref 39–117)
BUN: 14 mg/dL (ref 6–23)
CO2: 25 mEq/L (ref 19–32)
Calcium: 9.9 mg/dL (ref 8.4–10.5)
Chloride: 102 mEq/L (ref 96–112)
Creatinine, Ser: 0.83 mg/dL (ref 0.40–1.20)
GFR: 85.76 mL/min (ref >60.00)
Glucose, Bld: 91 mg/dL (ref 70–99)
Potassium: 4.1 mEq/L (ref 3.5–5.1)
Sodium: 135 mEq/L (ref 135–145)
Total Bilirubin: 0.9 mg/dL (ref 0.2–1.2)
Total Protein: 7 g/dL (ref 6.0–8.3)

## 2023-01-28 LAB — CBC WITH DIFFERENTIAL/PLATELET
Basophils Absolute: 0.1 10*3/uL (ref 0.0–0.1)
Basophils Relative: 1.1 % (ref 0.0–3.0)
Eosinophils Absolute: 0.4 10*3/uL (ref 0.0–0.7)
Eosinophils Relative: 5.7 % — ABNORMAL HIGH (ref 0.0–5.0)
HCT: 42.7 % (ref 36.0–46.0)
Hemoglobin: 13.6 g/dL (ref 12.0–15.0)
Lymphocytes Relative: 29.4 % (ref 12.0–46.0)
Lymphs Abs: 2 10*3/uL (ref 0.7–4.0)
MCHC: 31.8 g/dL (ref 30.0–36.0)
MCV: 92.5 fl (ref 78.0–100.0)
Monocytes Absolute: 0.5 10*3/uL (ref 0.1–1.0)
Monocytes Relative: 7.3 % (ref 3.0–12.0)
Neutro Abs: 3.8 10*3/uL (ref 1.4–7.7)
Neutrophils Relative %: 56.5 % (ref 43.0–77.0)
Platelets: 237 10*3/uL (ref 150.0–400.0)
RBC: 4.61 Mil/uL (ref 3.87–5.11)
RDW: 12.9 % (ref 11.5–15.5)
WBC: 6.6 10*3/uL (ref 4.0–10.5)

## 2023-01-28 LAB — LIPID PANEL
Cholesterol: 156 mg/dL (ref 0–200)
HDL: 49.2 mg/dL (ref >39.00)
LDL Cholesterol: 87 mg/dL (ref 0–99)
NonHDL: 107.01
Total CHOL/HDL Ratio: 3
Triglycerides: 98 mg/dL (ref 0.0–149.0)
VLDL: 19.6 mg/dL (ref 0.0–40.0)

## 2023-01-28 LAB — TSH: TSH: 2.41 u[IU]/mL (ref 0.35–5.50)

## 2023-01-28 LAB — HEMOGLOBIN A1C: Hgb A1c MFr Bld: 5.4 % (ref 4.6–6.5)

## 2023-01-28 MED ORDER — ZOLMITRIPTAN 5 MG PO TABS: 5.0000 mg | ORAL_TABLET | ORAL | 2 refills | Status: DC | PRN

## 2023-01-28 NOTE — Progress Notes (Signed)
Established Patient Office Visit  Subjective   Patient ID: Amanda Wood, female    DOB: 1980/10/12  Age: 42 y.o. MRN: [id number]  Chief Complaint  Patient presents with   Annual Exam    Pt states fasting     HPI Discussed the use of AI scribe software for clinical note transcription with the patient, who gave verbal consent to proceed.  History of Present Illness   The patient, with a history of headaches, presents for a routine physical. She reports that her headaches have not significantly improved or worsened since the insertion of a Mirena IUD. The patient also mentions a decrease in physical activity since running a half marathon in [place]. She expresses concern about her A1c levels due to a decrease in physical activity and an increase in dessert consumption. The patient has been trying to improve her diet by eating more at home. She also mentions a recent change in her relationship status and a desire to return to regular running.      Patient Active Problem List   Diagnosis Date Noted   Encounter for contraceptive management 01/22/2022   Left foot pain 10/02/2019   Left medial knee pain 08/08/2018   Preventative health care 11/26/2016   Menstrual migraine 01/29/2014   Past Medical History:  Diagnosis Date   Lactose intolerance    Migraines    Sciatica    left leg   History reviewed. No pertinent surgical history. Social History   Tobacco Use   Smoking status: Never   Smokeless tobacco: Never  Substance Use Topics   Alcohol use: Yes    Comment: 1-2 glass of wine a week   Drug use: No   Social History   Socioeconomic History   Marital status: Single    Spouse name: Not on file   Number of children: Not on file   Years of education: Not on file   Highest education level: Not on file  Occupational History   Occupation: physician    Comment: [employer]  Tobacco Use   Smoking status: Never   Smokeless tobacco: Never  Substance and Sexual Activity   Alcohol  use: Yes    Comment: 1-2 glass of wine a week   Drug use: No   Sexual activity: Yes    Partners: Male    Birth control/protection: Condom  Other Topics Concern   Not on file  Social History Narrative   Running for exercise --- training for 1/2 marathan in [place]   Social Determinants of Health   Financial Resource Strain: Not on file  Food Insecurity: Not on file  Transportation Needs: Not on file  Physical Activity: Not on file  Stress: Not on file  Social Connections: Not on file  Intimate Partner Violence: Not on file   Family Status  Relation Name Status   Mother  Alive   Father  Alive   MGM  (Not Specified)   PGM  (Not Specified)  No partnership data on file   Family History  Problem Relation Age of Onset   Hypertension Mother    Osteoarthritis Mother    Hyperlipidemia Mother    Hypertension Father    Osteoarthritis Father    Dementia Maternal Grandmother    Stroke Paternal Grandmother    No Known Allergies    Review of Systems  Constitutional:  Negative for fever and malaise/fatigue.  HENT:  Negative for congestion.   Eyes:  Negative for blurred vision.  Respiratory:  Negative for cough  and shortness of breath.   Cardiovascular:  Negative for chest pain, palpitations and leg swelling.  Gastrointestinal:  Negative for abdominal pain, blood in stool, nausea and vomiting.  Genitourinary:  Negative for dysuria and frequency.  Musculoskeletal:  Negative for back pain and falls.  Skin:  Negative for rash.  Neurological:  Negative for dizziness, loss of consciousness and headaches.  Endo/Heme/Allergies:  Negative for environmental allergies.  Psychiatric/Behavioral:  Negative for depression. The patient is not nervous/anxious.       Objective:     BP 112/80 (BP Location: Left Arm, Patient Position: Sitting, Cuff Size: Normal)   Pulse 79   Temp 98.6 F (37 C) (Oral)   Resp 18   Ht 5\' 6"  (1.676 m)   Wt 176 lb 9.6 oz (80.1 kg)   SpO2 98%   BMI 28.50 kg/m   BP Readings from Last 3 Encounters:  01/28/23 112/80  01/22/22 130/88  08/07/21 120/80   Wt Readings from Last 3 Encounters:  01/28/23 176 lb 9.6 oz (80.1 kg)  01/22/22 171 lb (77.6 kg)  08/07/21 169 lb (76.7 kg)   SpO2 Readings from Last 3 Encounters:  01/28/23 98%  01/22/22 99%  08/07/21 98%      Physical Exam Vitals and nursing note reviewed.  Constitutional:      General: She is not in acute distress.    Appearance: Normal appearance. She is well-developed.  HENT:     Head: Normocephalic and atraumatic.     Right Ear: Tympanic membrane, ear canal and external ear normal. There is no impacted cerumen.     Left Ear: Tympanic membrane, ear canal and external ear normal. There is no impacted cerumen.     Nose: Nose normal.     Mouth/Throat:     Mouth: Mucous membranes are moist.     Pharynx: Oropharynx is clear. No oropharyngeal exudate or posterior oropharyngeal erythema.  Eyes:     General: No scleral icterus.       Right eye: No discharge.        Left eye: No discharge.     Conjunctiva/sclera: Conjunctivae normal.     Pupils: Pupils are equal, round, and reactive to light.  Neck:     Thyroid: No thyromegaly or thyroid tenderness.     Vascular: No JVD.  Cardiovascular:     Rate and Rhythm: Normal rate and regular rhythm.     Heart sounds: Normal heart sounds. No murmur heard. Pulmonary:     Effort: Pulmonary effort is normal. No respiratory distress.     Breath sounds: Normal breath sounds.  Abdominal:     General: Bowel sounds are normal. There is no distension.     Palpations: Abdomen is soft. There is no mass.     Tenderness: There is no abdominal tenderness. There is no guarding or rebound.  Genitourinary:    Vagina: Normal.  Musculoskeletal:        General: Normal range of motion.     Cervical back: Normal range of motion and neck supple.     Right lower leg: No edema.     Left lower leg: No edema.  Lymphadenopathy:     Cervical: No cervical  adenopathy.  Skin:    General: Skin is warm and dry.     Findings: No erythema or rash.  Neurological:     General: No focal deficit present.     Mental Status: She is alert and oriented to person, place, and time.  Cranial Nerves: No cranial nerve deficit.     Deep Tendon Reflexes: Reflexes are normal and symmetric.  Psychiatric:        Mood and Affect: Mood normal.        Behavior: Behavior normal.        Thought Content: Thought content normal.        Judgment: Judgment normal.      No results found for any visits on 01/28/23.  Last CBC Lab Results  Component Value Date   WBC 6.9 01/22/2022   HGB 13.5 01/22/2022   HCT 40.4 01/22/2022   MCV 91.3 01/22/2022   MCH 30.2 09/15/2017   RDW 13.1 01/22/2022   PLT 254.0 01/22/2022   Last metabolic panel Lab Results  Component Value Date   GLUCOSE 96 01/22/2022   NA 139 01/22/2022   K 4.5 01/22/2022   CL 103 01/22/2022   CO2 27 01/22/2022   BUN 13 01/22/2022   CREATININE 0.86 01/22/2022   GFR 82.77 01/22/2022   CALCIUM 10.1 01/22/2022   PROT 7.2 01/22/2022   ALBUMIN 4.7 01/22/2022   BILITOT 0.7 01/22/2022   ALKPHOS 58 01/22/2022   AST 12 01/22/2022   ALT 11 01/22/2022   Last lipids Lab Results  Component Value Date   CHOL 163 01/22/2022   HDL 61.10 01/22/2022   LDLCALC 88 01/22/2022   TRIG 69.0 01/22/2022   CHOLHDL 3 01/22/2022   Last hemoglobin A1c No results found for: "HGBA1C" Last thyroid functions Lab Results  Component Value Date   TSH 2.37 01/22/2022   Last vitamin D No results found for: "25OHVITD2", "25OHVITD3", "VD25OH" Last vitamin B12 and Folate No results found for: "VITAMINB12", "FOLATE"    The 10-year ASCVD risk score (Arnett DK, et al., 2019) is: 0.3%    Assessment & Plan:   Problem List Items Addressed This Visit       Unprioritized   Preventative health care - Primary    Ghm utd Check labs  See AVS Health Maintenance  Topic Date Due   HIV Screening  Never done    COVID-19 Vaccine (6 - 2023-24 season) 05/17/2022   INFLUENZA VACCINE  01/20/2023   PAP SMEAR-Modifier  01/21/2024   DTaP/Tdap/Td (2 - Td or Tdap) 11/27/2026   Hepatitis C Screening  Completed   HPV VACCINES  Aged Out         Relevant Orders   CBC with Differential/Platelet   Comprehensive metabolic panel   Lipid panel   TSH   Hemoglobin A1c   Menstrual migraine    Stable  Has mirena iud And maxalt      Relevant Medications   zolmitriptan (ZOMIG) 5 MG tablet  Assessment and Plan    Contraception Mirena IUD in place. No complications reported. -Continue current contraceptive method.  Migraines Reports variable frequency of migraines. Currently on Zomig as needed. -Continue Zomig as needed.  General Health Maintenance -Order labs including CBC, CMP, lipids, thyroid, and A1C. -Continue annual flu vaccination.  Follow-up -Return for routine annual physical next year or sooner if any new issues arise.        No follow-ups on file.    Donato Schultz, DO

## 2023-01-28 NOTE — Assessment & Plan Note (Signed)
Stable  Has mirena iud And maxalt

## 2023-01-28 NOTE — Assessment & Plan Note (Signed)
Ghm utd Check labs  See AVS Health Maintenance  Topic Date Due   HIV Screening  Never done   COVID-19 Vaccine (6 - 2023-24 season) 05/17/2022   INFLUENZA VACCINE  01/20/2023   PAP SMEAR-Modifier  01/21/2024   DTaP/Tdap/Td (2 - Td or Tdap) 11/27/2026   Hepatitis C Screening  Completed   HPV VACCINES  Aged Out

## 2023-09-05 ENCOUNTER — Other Ambulatory Visit: Payer: Self-pay | Admitting: Family Medicine

## 2023-09-05 DIAGNOSIS — G43829 Menstrual migraine, not intractable, without status migrainosus: Secondary | ICD-10-CM

## 2023-11-26 ENCOUNTER — Encounter: Payer: Self-pay | Admitting: Family Medicine

## 2023-11-28 ENCOUNTER — Other Ambulatory Visit: Payer: Self-pay | Admitting: Family Medicine

## 2023-11-28 DIAGNOSIS — Z7184 Encounter for health counseling related to travel: Secondary | ICD-10-CM

## 2023-11-28 MED ORDER — SCOPOLAMINE 1 MG/3DAYS TD PT72
1.0000 | MEDICATED_PATCH | TRANSDERMAL | 12 refills | Status: DC
Start: 2023-11-28 — End: 2024-01-30

## 2023-11-28 MED ORDER — CIPROFLOXACIN HCL 500 MG PO TABS
500.0000 mg | ORAL_TABLET | Freq: Two times a day (BID) | ORAL | 0 refills | Status: AC
Start: 2023-11-28 — End: 2023-12-08

## 2024-01-30 ENCOUNTER — Encounter: Payer: Self-pay | Admitting: Family Medicine

## 2024-01-30 ENCOUNTER — Ambulatory Visit (INDEPENDENT_AMBULATORY_CARE_PROVIDER_SITE_OTHER): Payer: PRIVATE HEALTH INSURANCE | Admitting: Family Medicine

## 2024-01-30 ENCOUNTER — Ambulatory Visit: Payer: Self-pay | Admitting: Family Medicine

## 2024-01-30 VITALS — BP 110/80 | HR 86 | Temp 98.2°F | Resp 18 | Ht 66.0 in | Wt 175.8 lb

## 2024-01-30 DIAGNOSIS — Z1211 Encounter for screening for malignant neoplasm of colon: Secondary | ICD-10-CM | POA: Diagnosis not present

## 2024-01-30 DIAGNOSIS — Z0000 Encounter for general adult medical examination without abnormal findings: Secondary | ICD-10-CM | POA: Diagnosis not present

## 2024-01-30 DIAGNOSIS — G43829 Menstrual migraine, not intractable, without status migrainosus: Secondary | ICD-10-CM | POA: Diagnosis not present

## 2024-01-30 DIAGNOSIS — R739 Hyperglycemia, unspecified: Secondary | ICD-10-CM | POA: Diagnosis not present

## 2024-01-30 DIAGNOSIS — D229 Melanocytic nevi, unspecified: Secondary | ICD-10-CM

## 2024-01-30 DIAGNOSIS — Z0184 Encounter for antibody response examination: Secondary | ICD-10-CM

## 2024-01-30 LAB — COMPREHENSIVE METABOLIC PANEL WITH GFR
ALT: 13 U/L (ref 0–35)
AST: 15 U/L (ref 0–37)
Albumin: 4.6 g/dL (ref 3.5–5.2)
Alkaline Phosphatase: 59 U/L (ref 39–117)
BUN: 11 mg/dL (ref 6–23)
CO2: 27 meq/L (ref 19–32)
Calcium: 9.7 mg/dL (ref 8.4–10.5)
Chloride: 103 meq/L (ref 96–112)
Creatinine, Ser: 0.79 mg/dL (ref 0.40–1.20)
GFR: 90.35 mL/min (ref >60.00)
Glucose, Bld: 93 mg/dL (ref 70–99)
Potassium: 4.2 meq/L (ref 3.5–5.1)
Sodium: 139 meq/L (ref 135–145)
Total Bilirubin: 1 mg/dL (ref 0.2–1.2)
Total Protein: 6.9 g/dL (ref 6.0–8.3)

## 2024-01-30 LAB — LIPID PANEL
Cholesterol: 160 mg/dL (ref 0–200)
HDL: 50.6 mg/dL (ref >39.00)
LDL Cholesterol: 95 mg/dL (ref 0–99)
NonHDL: 108.9
Total CHOL/HDL Ratio: 3
Triglycerides: 70 mg/dL (ref 0.0–149.0)
VLDL: 14 mg/dL (ref 0.0–40.0)

## 2024-01-30 LAB — CBC WITH DIFFERENTIAL/PLATELET
Basophils Absolute: 0 K/uL (ref 0.0–0.1)
Basophils Relative: 0.8 % (ref 0.0–3.0)
Eosinophils Absolute: 0.2 K/uL (ref 0.0–0.7)
Eosinophils Relative: 2.8 % (ref 0.0–5.0)
HCT: 42.2 % (ref 36.0–46.0)
Hemoglobin: 14.1 g/dL (ref 12.0–15.0)
Lymphocytes Relative: 31 % (ref 12.0–46.0)
Lymphs Abs: 1.6 K/uL (ref 0.7–4.0)
MCHC: 33.4 g/dL (ref 30.0–36.0)
MCV: 90.2 fl (ref 78.0–100.0)
Monocytes Absolute: 0.4 K/uL (ref 0.1–1.0)
Monocytes Relative: 7 % (ref 3.0–12.0)
Neutro Abs: 3.1 K/uL (ref 1.4–7.7)
Neutrophils Relative %: 58.4 % (ref 43.0–77.0)
Platelets: 237 K/uL (ref 150.0–400.0)
RBC: 4.68 Mil/uL (ref 3.87–5.11)
RDW: 12.9 % (ref 11.5–15.5)
WBC: 5.3 K/uL (ref 4.0–10.5)

## 2024-01-30 LAB — HEMOGLOBIN A1C: Hgb A1c MFr Bld: 5.7 % (ref 4.6–6.5)

## 2024-01-30 LAB — VITAMIN D 25 HYDROXY (VIT D DEFICIENCY, FRACTURES): VITD: 67.42 ng/mL (ref 30.00–100.00)

## 2024-01-30 LAB — TSH: TSH: 2.47 u[IU]/mL (ref 0.35–5.50)

## 2024-01-30 MED ORDER — ZOLMITRIPTAN 5 MG PO TABS: 5.0000 mg | ORAL_TABLET | ORAL | 3 refills | Status: AC | PRN

## 2024-01-30 NOTE — Patient Instructions (Signed)
 Preventive Care 45-45 Years Old, Female  Preventive care refers to lifestyle choices and visits with your health care provider that can promote health and wellness. Preventive care visits are also called wellness exams.  What can I expect for my preventive care visit?  Counseling  Your health care provider may ask you questions about your:  Medical history, including:  Past medical problems.  Family medical history.  Pregnancy history.  Current health, including:  Menstrual cycle.  Method of birth control.  Emotional well-being.  Home life and relationship well-being.  Sexual activity and sexual health.  Lifestyle, including:  Alcohol, nicotine or tobacco, and drug use.  Access to firearms.  Diet, exercise, and sleep habits.  Work and work Astronomer.  Sunscreen use.  Safety issues such as seatbelt and bike helmet use.  Physical exam  Your health care provider will check your:  Height and weight. These may be used to calculate your BMI (body mass index). BMI is a measurement that tells if you are at a healthy weight.  Waist circumference. This measures the distance around your waistline. This measurement also tells if you are at a healthy weight and may help predict your risk of certain diseases, such as type 2 diabetes and high blood pressure.  Heart rate and blood pressure.  Body temperature.  Skin for abnormal spots.  What immunizations do I need?    Vaccines are usually given at various ages, according to a schedule. Your health care provider will recommend vaccines for you based on your age, medical history, and lifestyle or other factors, such as travel or where you work.  What tests do I need?  Screening  Your health care provider may recommend screening tests for certain conditions. This may include:  Lipid and cholesterol levels.  Diabetes screening. This is done by checking your blood sugar (glucose) after you have not eaten for a while (fasting).  Pelvic exam and Pap test.  Hepatitis B test.  Hepatitis C  test.  HIV (human immunodeficiency virus) test.  STI (sexually transmitted infection) testing, if you are at risk.  Lung cancer screening.  Colorectal cancer screening.  Mammogram. Talk with your health care provider about when you should start having regular mammograms. This may depend on whether you have a family history of breast cancer.  BRCA-related cancer screening. This may be done if you have a family history of breast, ovarian, tubal, or peritoneal cancers.  Bone density scan. This is done to screen for osteoporosis.  Talk with your health care provider about your test results, treatment options, and if necessary, the need for more tests.  Follow these instructions at home:  Eating and drinking    Eat a diet that includes fresh fruits and vegetables, whole grains, lean protein, and low-fat dairy products.  Take vitamin and mineral supplements as recommended by your health care provider.  Do not drink alcohol if:  Your health care provider tells you not to drink.  You are pregnant, may be pregnant, or are planning to become pregnant.  If you drink alcohol:  Limit how much you have to 0-1 drink a day.  Know how much alcohol is in your drink. In the [place] one drink equals one 12 oz bottle of beer (355 mL), one 5 oz glass of wine (148 mL), or one 1 oz glass of hard liquor (44 mL).  Lifestyle  Brush your teeth every morning and night with fluoride toothpaste. Floss one time each day.  Exercise for at least  30 minutes 5 or more days each week.  Do not use any products that contain nicotine or tobacco. These products include cigarettes, chewing tobacco, and vaping devices, such as e-cigarettes. If you need help quitting, ask your health care provider.  Do not use drugs.  If you are sexually active, practice safe sex. Use a condom or other form of protection to prevent STIs.  If you do not wish to become pregnant, use a form of birth control. If you plan to become pregnant, see your health care provider for a  prepregnancy visit.  Take aspirin only as told by your health care provider. Make sure that you understand how much to take and what form to take. Work with your health care provider to find out whether it is safe and beneficial for you to take aspirin daily.  Find healthy ways to manage stress, such as:  Meditation, yoga, or listening to music.  Journaling.  Talking to a trusted person.  Spending time with friends and family.  Minimize exposure to UV radiation to reduce your risk of skin cancer.  Safety  Always wear your seat belt while driving or riding in a vehicle.  Do not drive:  If you have been drinking alcohol. Do not ride with someone who has been drinking.  When you are tired or distracted.  While texting.  If you have been using any mind-altering substances or drugs.  Wear a helmet and other protective equipment during sports activities.  If you have firearms in your house, make sure you follow all gun safety procedures.  Seek help if you have been physically or sexually abused.  What's next?  Visit your health care provider once a year for an annual wellness visit.  Ask your health care provider how often you should have your eyes and teeth checked.  Stay up to date on all vaccines.  This information is not intended to replace advice given to you by your health care provider. Make sure you discuss any questions you have with your health care provider.  Document Revised: 12/03/2020 Document Reviewed: 12/03/2020  Elsevier Patient Education  2024 [employer].

## 2024-01-30 NOTE — Assessment & Plan Note (Signed)
 Ghm utd Check labs  See AVS Health Maintenance  Topic Date Due   HIV Screening  Never done   Hepatitis B Vaccines (1 of 3 - 19+ 3-dose series) Never done   HPV VACCINES (1 - 3-dose SCDM series) Never done   COVID-19 Vaccine (6 - 2024-25 season) 02/20/2023   Colonoscopy  Never done   Cervical Cancer Screening (HPV/Pap Cotest)  01/21/2024   INFLUENZA VACCINE  01/20/2024   DTaP/Tdap/Td (2 - Td or Tdap) 11/27/2026   Hepatitis C Screening  Completed   Meningococcal B Vaccine  Aged Out

## 2024-01-30 NOTE — Progress Notes (Addendum)
 Subjective:    Patient ID: Amanda Wood, female    DOB: Dec 19, 1980, 43 y.o.   MRN: [id number]  Chief Complaint  Patient presents with   Annual Exam    Pt states fasting     HPI Patient is in today for cpe.  Discussed the use of AI scribe software for clinical note transcription with the patient, who gave verbal consent to proceed.  History of Present Illness Amanda Wood is a 43 year old female who presents for an annual physical exam.  She mentions needing to return to her gynecologist for an IUD check. She had an IUD placed previously and had one follow-up visit after the procedure.  She is due for a colonoscopy and is considering different providers for the procedure. She expresses apprehension about the preparation process.  She needs a mammogram, which she previously had done at a different location, and is considering having it done at a mobile unit that visits her workplace.   She is currently taking Zomig  and vitamins. She runs and does weight exercises but has not been doing much yoga recently. She reports that her stomach is okay and her joints are doing all right.  She has a skin concern, describing a 'divit' that has been present for months, coming and going, but has not yet sought medical evaluation for it.    Past Medical History:  Diagnosis Date   Lactose intolerance    Migraines    Sciatica    left leg    History reviewed. No pertinent surgical history.  Family History  Problem Relation Age of Onset   Hypertension Mother    Osteoarthritis Mother    Hyperlipidemia Mother    Hypertension Father    Osteoarthritis Father    Atrial fibrillation Father    Dementia Maternal Grandmother    Stroke Paternal Grandmother     Social History   Socioeconomic History   Marital status: Single    Spouse name: Not on file   Number of children: Not on file   Years of education: Not on file   Highest education level: Not on file  Occupational History    Occupation: physician    Comment: [employer]    Employer: [employer]    Comment: [employer]  Tobacco Use   Smoking status: Never   Smokeless tobacco: Never  Substance and Sexual Activity   Alcohol use: Yes    Comment: 1-2 glass of wine a week   Drug use: No   Sexual activity: Yes    Partners: Male    Birth control/protection: Condom, I.U.D.  Other Topics Concern   Not on file  Social History Narrative   Running for exercise --- training for 1/2 marathan in [place]   Social Drivers of Health   Financial Resource Strain: Not on file  Food Insecurity: Not on file  Transportation Needs: Not on file  Physical Activity: Not on file  Stress: Not on file  Social Connections: Not on file  Intimate Partner Violence: Not on file    Outpatient Medications Prior to Visit  Medication Sig Dispense Refill   Cholecalciferol (SM VITAMIN D3) 100 MCG (4000 UT) CAPS Take by mouth.     Multiple Vitamins-Minerals (MULTIVITAMIN WITH MINERALS) tablet Take 1 tablet by mouth daily.     zolmitriptan  (ZOMIG ) 5 MG tablet TAKE 1 TABLET BY MOUTH AS NEEDED FOR MIGRAINE. 10 tablet 3   scopolamine  (TRANSDERM SCOP , 1.5 MG,) 1 MG/3DAYS Place 1 patch (1.5 mg total) onto  the skin every 3 (three) days. 10 patch 12   No facility-administered medications prior to visit.    No Known Allergies  Review of Systems  Constitutional:  Negative for chills, fever and malaise/fatigue.  HENT:  Negative for congestion and hearing loss.   Eyes:  Negative for discharge.  Respiratory:  Negative for cough, sputum production and shortness of breath.   Cardiovascular:  Negative for chest pain, palpitations and leg swelling.  Gastrointestinal:  Negative for abdominal pain, blood in stool, constipation, diarrhea, heartburn, nausea and vomiting.  Genitourinary:  Negative for dysuria, frequency, hematuria and urgency.  Musculoskeletal:  Negative for back pain, falls and myalgias.  Skin:  Negative for rash.  Neurological:  Negative for  dizziness, sensory change, loss of consciousness, weakness and headaches.  Endo/Heme/Allergies:  Negative for environmental allergies. Does not bruise/bleed easily.  Psychiatric/Behavioral:  Negative for depression and suicidal ideas. The patient is not nervous/anxious and does not have insomnia.        Objective:    Physical Exam Vitals and nursing note reviewed.  Constitutional:      General: She is not in acute distress.    Appearance: Normal appearance. She is well-developed.  HENT:     Head: Normocephalic and atraumatic.     Right Ear: Tympanic membrane, ear canal and external ear normal. There is no impacted cerumen.     Left Ear: Tympanic membrane, ear canal and external ear normal. There is no impacted cerumen.     Nose: Nose normal.     Mouth/Throat:     Mouth: Mucous membranes are moist.     Pharynx: Oropharynx is clear. No oropharyngeal exudate or posterior oropharyngeal erythema.  Eyes:     General: No scleral icterus.       Right eye: No discharge.        Left eye: No discharge.     Conjunctiva/sclera: Conjunctivae normal.     Pupils: Pupils are equal, round, and reactive to light.  Neck:     Thyroid : No thyromegaly or thyroid  tenderness.     Vascular: No JVD.  Cardiovascular:     Rate and Rhythm: Normal rate and regular rhythm.     Heart sounds: Normal heart sounds. No murmur heard. Pulmonary:     Effort: Pulmonary effort is normal. No respiratory distress.     Breath sounds: Normal breath sounds.  Abdominal:     General: Bowel sounds are normal. There is no distension.     Palpations: Abdomen is soft. There is no mass.     Tenderness: There is no abdominal tenderness. There is no guarding or rebound.  Musculoskeletal:        General: Normal range of motion.     Cervical back: Normal range of motion and neck supple.     Right lower leg: No edema.     Left lower leg: No edema.  Lymphadenopathy:     Cervical: No cervical adenopathy.  Skin:    General: Skin  is warm and dry.     Findings: No erythema or rash.  Neurological:     Mental Status: She is alert and oriented to person, place, and time.     Cranial Nerves: No cranial nerve deficit.     Deep Tendon Reflexes: Reflexes are normal and symmetric.  Psychiatric:        Mood and Affect: Mood normal.        Behavior: Behavior normal.        Thought Content: Thought content  normal.        Judgment: Judgment normal.     BP 110/80 (BP Location: Left Arm, Patient Position: Sitting, Cuff Size: Normal)   Pulse 86   Temp 98.2 F (36.8 C) (Oral)   Resp 18   Ht 5' 6 (1.676 m)   Wt 175 lb 12.8 oz (79.7 kg)   SpO2 98%   BMI 28.37 kg/m  Wt Readings from Last 3 Encounters:  01/30/24 175 lb 12.8 oz (79.7 kg)  01/28/23 176 lb 9.6 oz (80.1 kg)  01/22/22 171 lb (77.6 kg)     Media Information  Document Information  Photos    01/30/2024 08:50  Attached To:  Office Visit on 01/30/24 with Antonio Meth, Amanda SAUNDERS, DO  Source Information  Antonio Meth Amanda Wood, [place]  [facility]   Diabetic Foot Exam - Simple   No data filed    Lab Results  Component Value Date   WBC 6.6 01/28/2023   HGB 13.6 01/28/2023   HCT 42.7 01/28/2023   PLT 237.0 01/28/2023   GLUCOSE 91 01/28/2023   CHOL 156 01/28/2023   TRIG 98.0 01/28/2023   HDL 49.20 01/28/2023   LDLCALC 87 01/28/2023   ALT 10 01/28/2023   AST 11 01/28/2023   NA 135 01/28/2023   K 4.1 01/28/2023   CL 102 01/28/2023   CREATININE 0.83 01/28/2023   BUN 14 01/28/2023   CO2 25 01/28/2023   TSH 2.41 01/28/2023   HGBA1C 5.4 01/28/2023    Lab Results  Component Value Date   TSH 2.41 01/28/2023   Lab Results  Component Value Date   WBC 6.6 01/28/2023   HGB 13.6 01/28/2023   HCT 42.7 01/28/2023   MCV 92.5 01/28/2023   PLT 237.0 01/28/2023   Lab Results  Component Value Date   NA 135 01/28/2023   K 4.1 01/28/2023   CO2 25 01/28/2023   GLUCOSE 91 01/28/2023   BUN 14 01/28/2023   CREATININE 0.83 01/28/2023   BILITOT 0.9  01/28/2023   ALKPHOS 54 01/28/2023   AST 11 01/28/2023   ALT 10 01/28/2023   PROT 7.0 01/28/2023   ALBUMIN 4.7 01/28/2023   CALCIUM 9.9 01/28/2023   GFR 85.76 01/28/2023   Lab Results  Component Value Date   CHOL 156 01/28/2023   Lab Results  Component Value Date   HDL 49.20 01/28/2023   Lab Results  Component Value Date   LDLCALC 87 01/28/2023   Lab Results  Component Value Date   TRIG 98.0 01/28/2023   Lab Results  Component Value Date   CHOLHDL 3 01/28/2023   Lab Results  Component Value Date   HGBA1C 5.4 01/28/2023       Assessment & Plan:  Preventative health care Assessment & Plan: Ghm utd Check labs  See AVS Health Maintenance  Topic Date Due   HIV Screening  Never done   Hepatitis B Vaccines (1 of 3 - 19+ 3-dose series) Never done   HPV VACCINES (1 - 3-dose SCDM series) Never done   COVID-19 Vaccine (6 - 2024-25 season) 02/20/2023   Colonoscopy  Never done   Cervical Cancer Screening (HPV/Pap Cotest)  01/21/2024   INFLUENZA VACCINE  01/20/2024   DTaP/Tdap/Td (2 - Td or Tdap) 11/27/2026   Hepatitis C Screening  Completed   Meningococcal B Vaccine  Aged Out     Orders: -     CBC with Differential/Platelet -     Comprehensive metabolic panel with GFR -  Lipid panel -     TSH -     VITAMIN D  25 Hydroxy (Vit-D Deficiency, Fractures)  Menstrual migraine without status migrainosus, not intractable -     ZOLMitriptan ; Take 1 tablet (5 mg total) by mouth as needed for migraine.  Dispense: 10 tablet; Refill: 3  Immunity status testing -     Hepatitis B surface antibody,quantitative  Colon cancer screening -     Ambulatory referral to Gastroenterology  Suspicious nevus -     Ambulatory referral to Dermatology  Hyperglycemia -     Hemoglobin A1c   Assessment and Plan Assessment & Plan Adult Wellness Visit   During her routine adult wellness visit, there were no significant changes in family history except for her father's atrial  fibrillation treated with cardioversion and ablation. She maintains regular eye and dental check-ups and engages in running and weight exercises. Colonoscopy screening was discussed due to her age, and reassurance was provided regarding the prep process. She prefers Dr. Nandigan for the procedure. Schedule a colonoscopy with Dr. Nandigan or Dr. Kristie. Administer a flu shot at work. Perform a hepatitis B titer.  Migraine   Her migraine is well-managed with Zomig  and vitamins, with no new symptoms or changes in management.  Skin lesion, possible squamous cell carcinoma   A skin lesion with characteristics suggestive of squamous cell carcinoma has been intermittently appearing and disappearing for months without prior dermatological evaluation. Refer to dermatology for evaluation of the skin lesion.       No data to display                           Amanda JONELLE Antonio Cyndee, DO

## 2024-01-31 LAB — HEPATITIS B SURFACE ANTIBODY, QUANTITATIVE: Hep B S AB Quant (Post): 14 m[IU]/mL (ref >=10)

## 2024-02-06 ENCOUNTER — Ambulatory Visit (INDEPENDENT_AMBULATORY_CARE_PROVIDER_SITE_OTHER): Payer: PRIVATE HEALTH INSURANCE | Admitting: Dermatology

## 2024-02-06 ENCOUNTER — Encounter: Payer: Self-pay | Admitting: Dermatology

## 2024-02-06 VITALS — BP 132/93 | HR 85

## 2024-02-06 DIAGNOSIS — C4491 Basal cell carcinoma of skin, unspecified: Secondary | ICD-10-CM

## 2024-02-06 DIAGNOSIS — C44311 Basal cell carcinoma of skin of nose: Secondary | ICD-10-CM

## 2024-02-06 DIAGNOSIS — Z808 Family history of malignant neoplasm of other organs or systems: Secondary | ICD-10-CM

## 2024-02-06 DIAGNOSIS — L821 Other seborrheic keratosis: Secondary | ICD-10-CM

## 2024-02-06 DIAGNOSIS — D229 Melanocytic nevi, unspecified: Secondary | ICD-10-CM

## 2024-02-06 DIAGNOSIS — L814 Other melanin hyperpigmentation: Secondary | ICD-10-CM

## 2024-02-06 DIAGNOSIS — D1801 Hemangioma of skin and subcutaneous tissue: Secondary | ICD-10-CM

## 2024-02-06 DIAGNOSIS — C44319 Basal cell carcinoma of skin of other parts of face: Secondary | ICD-10-CM | POA: Diagnosis not present

## 2024-02-06 DIAGNOSIS — L578 Other skin changes due to chronic exposure to nonionizing radiation: Secondary | ICD-10-CM | POA: Diagnosis not present

## 2024-02-06 DIAGNOSIS — D485 Neoplasm of uncertain behavior of skin: Secondary | ICD-10-CM

## 2024-02-06 DIAGNOSIS — D492 Neoplasm of unspecified behavior of bone, soft tissue, and skin: Secondary | ICD-10-CM | POA: Diagnosis not present

## 2024-02-06 DIAGNOSIS — Z1283 Encounter for screening for malignant neoplasm of skin: Secondary | ICD-10-CM

## 2024-02-06 DIAGNOSIS — W908XXA Exposure to other nonionizing radiation, initial encounter: Secondary | ICD-10-CM

## 2024-02-06 HISTORY — DX: Basal cell carcinoma of skin, unspecified: C44.91

## 2024-02-06 NOTE — Progress Notes (Signed)
 New Patient Visit   Subjective  Amanda Wood is a 43 y.o. female who presents for the following: Skin Cancer Screening and Full Body Skin Exam  The patient presents for Total-Body Skin Exam (TBSE) for skin cancer screening and mole check. The patient has spots, moles and lesions to be evaluated, some may be new or changing.  No hx of skin cancer. Family hx of BCC.  The following portions of the chart were reviewed this encounter and updated as appropriate: medications, allergies, medical history  Review of Systems:  No other skin or systemic complaints except as noted in HPI or Assessment and Plan.  Objective  Well appearing patient in no apparent distress; mood and affect are within normal limits.  A full examination was performed including scalp, head, eyes, ears, nose, lips, neck, chest, axillae, abdomen, back, buttocks, bilateral upper extremities, bilateral lower extremities, hands, feet, fingers, toes, fingernails, and toenails. All findings within normal limits unless otherwise noted below.   Relevant physical exam findings are noted in the Assessment and Plan.  Left Forehead 4mm pink scaly papule   Right Root of Nose 5 mm skin colored papule    Assessment & Plan   SKIN CANCER SCREENING PERFORMED TODAY.  ACTINIC DAMAGE - Chronic condition, secondary to cumulative UV/sun exposure - diffuse scaly erythematous macules with underlying dyspigmentation - Recommend daily broad spectrum sunscreen SPF 30+ to sun-exposed areas, reapply every 2 hours as needed.  - Staying in the shade or wearing long sleeves, sun glasses (UVA+UVB protection) and wide brim hats (4-inch brim around the entire circumference of the hat) are also recommended for sun protection.  - Call for new or changing lesions.  LENTIGINES, SEBORRHEIC KERATOSES, HEMANGIOMAS - Benign normal skin lesions - Benign-appearing - Call for any changes  MELANOCYTIC NEVI - Tan-brown and/or pink-flesh-colored  symmetric macules and papules - Benign appearing on exam today - Observation - Call clinic for new or changing moles - Recommend daily use of broad spectrum spf 30+ sunscreen to sun-exposed areas.   NEOPLASM OF UNCERTAIN BEHAVIOR OF SKIN (2) Left Forehead Skin / nail biopsy Type of biopsy: tangential   Informed consent: discussed and consent obtained   Timeout: patient name, date of birth, surgical site, and procedure verified   Procedure prep:  Patient was prepped and draped in usual sterile fashion Prep type:  Isopropyl alcohol Anesthesia: the lesion was anesthetized in a standard fashion   Anesthetic:  1% lidocaine w/ epinephrine 1-100,000 buffered w/ 8.4% NaHCO3 Instrument used: DermaBlade   Hemostasis achieved with: aluminum chloride   Outcome: patient tolerated procedure well   Post-procedure details: sterile dressing applied and wound care instructions given   Dressing type: petrolatum gauze and bandage    Specimen 1 - Surgical pathology Differential Diagnosis: r/o nmsc vs other  Check Margins: No Right Root of Nose Skin / nail biopsy Type of biopsy: tangential   Informed consent: discussed and consent obtained   Timeout: patient name, date of birth, surgical site, and procedure verified   Procedure prep:  Patient was prepped and draped in usual sterile fashion Prep type:  Isopropyl alcohol Anesthesia: the lesion was anesthetized in a standard fashion   Anesthetic:  1% lidocaine w/ epinephrine 1-100,000 buffered w/ 8.4% NaHCO3 Instrument used: DermaBlade   Hemostasis achieved with: aluminum chloride   Outcome: patient tolerated procedure well   Post-procedure details: sterile dressing applied and wound care instructions given   Dressing type: petrolatum gauze and bandage    Specimen 2 -  Surgical pathology Differential Diagnosis: r/o NMSC vs other  Check Margins: No  Return in about 6 months (around 08/08/2024) for TBSC.  I, Berwyn Lesches, Surg Tech III, am acting  as scribe for [name], MD.   Documentation: I have reviewed the above documentation for accuracy and completeness, and I agree with the above.  [name], MD

## 2024-02-06 NOTE — Patient Instructions (Signed)
 Important Information  Due to recent changes in healthcare laws, you may see results of your pathology and/or laboratory studies on MyChart before the doctors have had a chance to review them. We understand that in some cases there may be results that are confusing or concerning to you. Please understand that not all results are received at the same time and often the doctors may need to interpret multiple results in order to provide you with the best plan of care or course of treatment. Therefore, we ask that you please give us  2 business days to thoroughly review all your results before contacting the office for clarification. Should we see a critical lab result, you will be contacted sooner.   If You Need Anything After Your Visit  If you have any questions or concerns for your doctor, please call our main line at [phone] If no one answers, please leave a voicemail as directed and we will return your call as soon as possible. Messages left after 4 pm will be answered the following business day.   You may also send us  a message via MyChart. We typically respond to MyChart messages within 1-2 business days.  For prescription refills, please ask your pharmacy to contact our office. Our fax number is [phone].  If you have an urgent issue when the clinic is closed that cannot wait until the next business day, you can page your doctor at the number below.    Please note that while we do our best to be available for urgent issues outside of office hours, we are not available 24/7.   If you have an urgent issue and are unable to reach us , you may choose to seek medical care at your doctor's office, retail clinic, urgent care center, or emergency room.  If you have a medical emergency, please immediately call 911 or go to the emergency department. In the event of inclement weather, please call our main line at [phone] for an update on the status of any delays or  closures.  Dermatology Medication Tips: Please keep the boxes that topical medications come in in order to help keep track of the instructions about where and how to use these. Pharmacies typically print the medication instructions only on the boxes and not directly on the medication tubes.   If your medication is too expensive, please contact our office at [phone] or send us  a message through MyChart.   We are unable to tell what your co-pay for medications will be in advance as this is different depending on your insurance coverage. However, we may be able to find a substitute medication at lower cost or fill out paperwork to get insurance to cover a needed medication.   If a prior authorization is required to get your medication covered by your insurance company, please allow us  1-2 business days to complete this process.  Drug prices often vary depending on where the prescription is filled and some pharmacies may offer cheaper prices.  The website www.goodrx.com contains coupons for medications through different pharmacies. The prices here do not account for what the cost may be with help from insurance (it may be cheaper with your insurance), but the website can give you the price if you did not use any insurance.  - You can print the associated coupon and take it with your prescription to the pharmacy.  - You may also stop by our office during regular business hours and pick up a GoodRx coupon card.  - If  you need your prescription sent electronically to a different pharmacy, notify our office through [facility] or by phone at [phone]    Skin Education :   I counseled the patient regarding the following: Sun screen (SPF 30 or greater) should be applied during peak UV exposure (between 10am and 2pm) and reapplied after exercise or swimming.  The ABCDEs of melanoma were reviewed with the patient, and the importance of monthly self-examination of moles was emphasized.  Should any moles change in shape or color, or itch, bleed or burn, pt will contact our office for evaluation sooner then their interval appointment.  Plan: Sunscreen Recommendations I recommended a broad spectrum sunscreen with a SPF of 30 or higher. I explained that SPF 30 sunscreens block approximately 97 percent of the sun's harmful rays. Sunscreens should be applied at least 15 minutes prior to expected sun exposure and then every 2 hours after that as long as sun exposure continues. If swimming or exercising sunscreen should be reapplied every 45 minutes to an hour after getting wet or sweating. One ounce, or the equivalent of a shot glass full of sunscreen, is adequate to protect the skin not covered by a bathing suit. I also recommended a lip balm with a sunscreen as well. Sun protective clothing can be used in lieu of sunscreen but must be worn the entire time you are exposed to the sun's rays.  Patient Handout: Wound Care for Skin Biopsy Site  Taking Care of Your Skin Biopsy Site  Proper care of the biopsy site is essential for promoting healing and minimizing scarring. This handout provides instructions on how to care for your biopsy site to ensure optimal recovery.  1. Cleaning the Wound:  Clean the biopsy site daily with gentle soap and water. Gently pat the area dry with a clean, soft towel. Avoid harsh scrubbing or rubbing the area, as this can irritate the skin and delay healing.  2. Applying Aquaphor and Bandage:  After cleaning the wound, apply a thin layer of Aquaphor ointment to the biopsy site. Cover the area with a sterile bandage to protect it from dirt, bacteria, and friction. Change the bandage daily or as needed if it becomes soiled or wet.  3. Continued Care for One Week:  Repeat the cleaning, Aquaphor application, and bandaging process daily for one week following the biopsy procedure. Keeping the wound clean and moist during this initial healing period will help  prevent infection and promote optimal healing.  4. Massaging Aquaphor into the Area:  ---After one week, discontinue the use of bandages but continue to apply Aquaphor to the biopsy site. ----Gently massage the Aquaphor into the area using circular motions. ---Massaging the skin helps to promote circulation and prevent the formation of scar tissue.   Additional Tips:  Avoid exposing the biopsy site to direct sunlight during the healing process, as this can cause hyperpigmentation or worsen scarring. If you experience any signs of infection, such as increased redness, swelling, warmth, or drainage from the wound, contact your healthcare provider immediately. Follow any additional instructions provided by your healthcare provider for caring for the biopsy site and managing any discomfort. Conclusion:  Taking proper care of your skin biopsy site is crucial for ensuring optimal healing and minimizing scarring. By following these instructions for cleaning, applying Aquaphor, and massaging the area, you can promote a smooth and successful recovery. If you have any questions or concerns about caring for your biopsy site, don't hesitate to contact your healthcare  provider for guidance.

## 2024-02-07 ENCOUNTER — Ambulatory Visit: Payer: Self-pay | Admitting: Dermatology

## 2024-02-07 LAB — SURGICAL PATHOLOGY

## 2024-02-08 ENCOUNTER — Encounter: Payer: Self-pay | Admitting: Dermatology

## 2024-02-08 NOTE — Telephone Encounter (Signed)
 Advised patient of results and sent message to schedulers.

## 2024-02-08 NOTE — Telephone Encounter (Signed)
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 ----- Message ----- From: Interface, Lab In [facility] Sent: 02/07/2024   4:56 PM EDT To: [name], MD

## 2024-02-09 ENCOUNTER — Other Ambulatory Visit: Payer: Self-pay | Admitting: Family Medicine

## 2024-02-09 DIAGNOSIS — Z1231 Encounter for screening mammogram for malignant neoplasm of breast: Secondary | ICD-10-CM

## 2024-02-24 ENCOUNTER — Encounter: Payer: Self-pay | Admitting: Family Medicine

## 2024-03-05 ENCOUNTER — Encounter: Payer: Self-pay | Admitting: Dermatology

## 2024-03-07 ENCOUNTER — Ambulatory Visit: Admitting: Dermatology

## 2024-03-07 ENCOUNTER — Encounter: Payer: Self-pay | Admitting: Dermatology

## 2024-03-07 VITALS — BP 130/90 | HR 100 | Temp 98.5°F

## 2024-03-07 DIAGNOSIS — L578 Other skin changes due to chronic exposure to nonionizing radiation: Secondary | ICD-10-CM

## 2024-03-07 DIAGNOSIS — C4491 Basal cell carcinoma of skin, unspecified: Secondary | ICD-10-CM

## 2024-03-07 DIAGNOSIS — L814 Other melanin hyperpigmentation: Secondary | ICD-10-CM | POA: Diagnosis not present

## 2024-03-07 DIAGNOSIS — C44319 Basal cell carcinoma of skin of other parts of face: Secondary | ICD-10-CM | POA: Diagnosis not present

## 2024-03-07 DIAGNOSIS — W908XXA Exposure to other nonionizing radiation, initial encounter: Secondary | ICD-10-CM

## 2024-03-07 NOTE — Progress Notes (Signed)
 Follow-Up Visit   Subjective  Amanda Wood is a 43 y.o. 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 Corey.   Patient is a geriatrician.   The following portions of the chart were reviewed this encounter and updated as appropriate: medications, allergies, medical history  Review of Systems:  No other skin or systemic complaints except as noted in HPI or Assessment and Plan.  Objective  Well appearing patient in no apparent distress; mood and affect are within normal limits.  A focused examination was performed of the following areas: Right root of nose Relevant physical exam findings are noted in the Assessment and Plan.   Right Root of Nose Atrophic scar   Assessment & Plan   BASAL CELL CARCINOMA (BCC), UNSPECIFIED SITE Right Root of Nose Mohs surgery  Consent obtained: written  Anticoagulation: Was the anticoagulation regimen changed prior to Mohs? No    Anesthesia: Anesthesia method: local infiltration Local anesthetic: lidocaine 1% WITH epi  Procedure Details: Timeout: pre-procedure verification complete Procedure Prep: patient was prepped and draped in usual sterile fashion Biopsy accession number: [id number] Pre-Op diagnosis: basal cell carcinoma BCC subtype: nodular MohsAIQ Surgical site (if tumor spans multiple areas, please select predominant area): nose Surgery side: right Surgical site (from skin exam): Right Root of Nose Pre-operative length (cm): 0.4 Pre-operative width (cm): 0.4 Indications for Mohs surgery: anatomic location where tissue conservation is critical  Micrographic Surgery Details: Post-operative length (cm): 0.6 Post-operative width (cm): 0.7 Number of Mohs stages: 1 Cumulative additional sections past 5 per stage: 0 Post surgery depth of defect: dermis  Stage 1    Tumor features identified on Mohs section: no tumor  identified  Reconstruction: Was the defect reconstructed?: No    Opioids: Did the patient receive a prescription for opioid/narcotic related to Mohs surgery?: No     Basal Cell Carcinoma- left forehead- Bx proven - Scheduled for Mohs in October  No follow-ups on file.  Amanda Wood, CMA, am acting as scribe for RUFUS CHRISTELLA COREY, MD.    03/07/2024  HISTORY OF PRESENT ILLNESS  Amanda Wood is seen in consultation at the request of Dr. Corey for biopsy-proven Nodular Basal Cell Carcinoma of the right nasal root. They note that the area has been present for about 6 months increasing in size with time.  There is no history of previous treatment.  Reports no other new or changing lesions and has no other complaints today.  Medications and allergies: see patient chart.  Review of systems: Reviewed 8 systems and notable for the above skin cancer.  All other systems reviewed are unremarkable/negative, unless noted in the HPI. Past medical history, surgical history, family history, social history were also reviewed and are noted in the chart/questionnaire.    PHYSICAL EXAMINATION  General: Well-appearing, in no acute distress, alert and oriented x 4. Vitals reviewed in chart (if available).   Skin: Exam reveals a 0.4 x 0.4 cm erythematous papule and biopsy scar on the right nasal root. There are rhytids, telangiectasias, and lentigines, consistent with photodamage.  Biopsy report(s) reviewed, confirming the diagnosis.   ASSESSMENT  1) Nodular Basal Cell Carcinoma of the right nasal root 2) photodamage 3) solar lentigines   PLAN   1. Due to location, size, histology, or recurrence and the likelihood of subclinical extension as well as the need to conserve normal surrounding tissue, the patient was deemed acceptable for Mohs  micrographic surgery (MMS).  The nature and purpose of the procedure, associated benefits and risks including recurrence and scarring, possible complications such  as pain, infection, and bleeding, and alternative methods of treatment if appropriate were discussed with the patient during consent. The lesion location was verified by the patient, by reviewing previous notes, pathology reports, and by photographs as well as angulation measurements if available.  Informed consent was reviewed and signed by the patient, and timeout was performed at 8:30 AM. See op note below.  2. For the photodamage and solar lentigines, sun protection discussed/information given on OTC sunscreens, and we recommend continued regular follow-up with primary dermatologist every 6 months or sooner for any growing, bleeding, or changing lesions. 3. Prognosis and future surveillance discussed. 4. Letter with treatment outcome sent to referring provider. 5. Pain acetaminophen/ibuprofen   MOHS MICROGRAPHIC SURGERY AND RECONSTRUCTION  Initial size:   0.4 x 0.4 cm Surgical defect/wound size: 0.6 x 0.7 cm Anesthesia:    0.33% lidocaine with 1:200,000 epinephrine EBL:    <5 mL Complications:  None Repair type:   Second Intention   Stages: 1  STAGE I: Anesthesia achieved with 0.5% lidocaine with 1:200,000 epinephrine. ChloraPrep applied. 1 section(s) excised using Mohs technique (this includes total peripheral and deep tissue margin excision and evaluation with frozen sections, excised and interpreted by the same physician). The tumor was first debulked and then excised with an approx. 2mm margin.  Hemostasis was achieved with electrocautery as needed.  The specimen was then oriented, subdivided/relaxed, inked, and processed using Mohs technique.    Frozen section analysis revealed a clear deep and peripheral margin.  Reconstruction  Patient was notified of results and repair options were discussed, including second intention healing. After reviewing the advantages and disadvantages of each, we agreed on second intention healing as appropriate.   The surgical site was then lightly  scrubbed with sterile, saline-soaked gauze.  The area was bandaged using Vaseline ointment, non-adherent gauze, gauze pads, and tape to provide an adequate pressure dressing.   The patient tolerated the procedure well, was given detailed written and verbal wound care instructions, and was discharged in good condition.  The patient will follow-up in 3 weeks and as scheduled with primary dermatologist.    Documentation: I have reviewed the above documentation for accuracy and completeness, and I agree with the above.  [name], MD

## 2024-03-07 NOTE — Patient Instructions (Signed)

## 2024-03-15 ENCOUNTER — Encounter: Payer: Self-pay | Admitting: Dermatology

## 2024-03-26 ENCOUNTER — Encounter: Payer: Self-pay | Admitting: Dermatology

## 2024-03-29 ENCOUNTER — Ambulatory Visit: Admitting: Dermatology

## 2024-03-29 VITALS — BP 121/77 | HR 100 | Temp 98.5°F

## 2024-03-29 DIAGNOSIS — Z85828 Personal history of other malignant neoplasm of skin: Secondary | ICD-10-CM | POA: Diagnosis not present

## 2024-03-29 DIAGNOSIS — L905 Scar conditions and fibrosis of skin: Secondary | ICD-10-CM

## 2024-03-29 DIAGNOSIS — L578 Other skin changes due to chronic exposure to nonionizing radiation: Secondary | ICD-10-CM | POA: Diagnosis not present

## 2024-03-29 DIAGNOSIS — C44319 Basal cell carcinoma of skin of other parts of face: Secondary | ICD-10-CM | POA: Diagnosis not present

## 2024-03-29 DIAGNOSIS — L814 Other melanin hyperpigmentation: Secondary | ICD-10-CM

## 2024-03-29 DIAGNOSIS — C4491 Basal cell carcinoma of skin, unspecified: Secondary | ICD-10-CM

## 2024-03-29 NOTE — Patient Instructions (Addendum)

## 2024-03-29 NOTE — Progress Notes (Unsigned)
   Follow-Up Visit   Subjective  Shloka Baldridge Chrestman is a 43 y.o. female who presents for the following: mohs of left forehead  The following portions of the chart were reviewed this encounter and updated as appropriate: medications, allergies, medical history  Review of Systems:  No other skin or systemic complaints except as noted in HPI or Assessment and Plan.  Objective  Well appearing patient in no apparent distress; mood and affect are within normal limits.  A focused examination was performed of the following areas: Left forehead Relevant physical exam findings are noted in the Assessment and Plan.     Assessment & Plan   BASAL CELL CARCINOMA (BCC), UNSPECIFIED SITE Left Forehead Mohs surgery  Consent obtained: written  Anticoagulation: Was the anticoagulation regimen changed prior to Mohs? No    Procedure Details: Timeout: pre-procedure verification complete Procedure Prep: patient was prepped and draped in usual sterile fashion Biopsy accession number: [id number] Pre-Op diagnosis: basal cell carcinoma BCC subtype: nodular Surgical site (from skin exam): Left Forehead Pre-operative length (cm): 0.8 Pre-operative width (cm): 0.5  Micrographic Surgery Details: Post-operative length (cm): 1.7 Post-operative width (cm): 1.2 Number of Mohs stages: 2 Post surgery depth of defect: subcutaneous fat, skeletal muscle and perichondrium  Skin repair Complexity:  Complex Final length (cm):  4 Subcutaneous layers (deep stitches):  Suture size:  5-0 Suture type: Monocryl (poliglecaprone 25)   Fine/surface layer approximation (top stitches):  Suture size:  6-0 Suture type: fast-absorbing plain gut      No follow-ups on file.  I, Darice Smock, CMA, am acting as scribe for [name], MD.   Documentation: I have reviewed the above documentation for accuracy and completeness, and I agree with the above.  [name], MD

## 2024-03-30 ENCOUNTER — Ambulatory Visit
Admission: RE | Admit: 2024-03-30 | Discharge: 2024-03-30 | Disposition: A | Discharge status: Home / Self Care | Source: Ambulatory Visit | Attending: Family Medicine | Admitting: Family Medicine

## 2024-03-30 ENCOUNTER — Encounter: Payer: Self-pay | Admitting: Dermatology

## 2024-03-30 DIAGNOSIS — Z1231 Encounter for screening mammogram for malignant neoplasm of breast: Secondary | ICD-10-CM

## 2024-04-09 ENCOUNTER — Encounter: Payer: Self-pay | Admitting: Dermatology

## 2024-04-21 ENCOUNTER — Encounter: Payer: Self-pay | Admitting: Family Medicine

## 2024-04-26 ENCOUNTER — Ambulatory Visit: Admitting: Dermatology

## 2024-04-26 ENCOUNTER — Encounter: Payer: Self-pay | Admitting: Dermatology

## 2024-04-26 VITALS — BP 144/88 | HR 87

## 2024-04-26 DIAGNOSIS — Z85828 Personal history of other malignant neoplasm of skin: Secondary | ICD-10-CM | POA: Diagnosis not present

## 2024-04-26 DIAGNOSIS — L905 Scar conditions and fibrosis of skin: Secondary | ICD-10-CM

## 2024-04-26 DIAGNOSIS — C4491 Basal cell carcinoma of skin, unspecified: Secondary | ICD-10-CM

## 2024-04-26 NOTE — Patient Instructions (Signed)

## 2024-04-26 NOTE — Progress Notes (Unsigned)
   Follow Up Visit   Subjective  Amanda Wood is a 43 y.o. female who presents for the following: follow up from Mohs surgery   The patient presents for follow up from Mohs surgery for a BCC on the left forehead, treated on 03/29/2024, repaired with a complex linear closure. The patient has been bandaging the wound as directed. The endorse the following concerns: none.   Patient is also s/p mohs on the right root of nose, treated on 03/07/2024, healing by second intention. Patient has been using mederma over the area.   The following portions of the chart were reviewed this encounter and updated as appropriate: medications, allergies, medical history  Review of Systems:  No other skin or systemic complaints except as noted in HPI or Assessment and Plan.  Objective  Well appearing patient in no apparent distress; mood and affect are within normal limits.  A focal examination was performed including the left forehead. All findings within normal limits unless otherwise noted below.  Healing wound with mild erythema  Relevant physical exam findings are noted in the Assessment and Plan.       Assessment & Plan   Scar s/p Mohs for a BCC on the left forehead, treated on 03/29/2024, repaired with a complex linear closure. - Reassured that wound has healed well - Discussed that scars take up to 12 months to mature from the date of surgery - Recommend SPF 30+ to scar daily to prevent purple color - OK to start scar massage at 4-6 weeks post-op - Can consider silicone based products for scar healing   Scar s/p Mohs for a BCC on the right root of nose, treated on 03/07/2024, healing by second intention.  - Reassured that wound has healed well - Discussed that scars take up to 12 months to mature from the date of surgery - Recommend SPF 30+ to scar daily to prevent purple color - OK to start scar massage at 4-6 weeks post-op - Can consider silicone based products for scar healing    HISTORY OF BASAL CELL CARCINOMA OF THE SKIN - No evidence of recurrence today - Recommend regular full body skin exams - Recommend daily broad spectrum sunscreen SPF 30+ to sun-exposed areas, reapply every 2 hours as needed.  - Call if any new or changing lesions are noted between office visits  Return in 6 months (on 10/24/2024) for next skin exam.  I, Rollene Gobble, RN, am acting as scribe for Amanda CHRISTELLA HOLY, MD .   Documentation: I have reviewed the above documentation for accuracy and completeness, and I agree with the above.  Amanda CHRISTELLA HOLY, MD

## 2024-04-27 ENCOUNTER — Encounter: Payer: Self-pay | Admitting: Dermatology

## 2024-04-30 ENCOUNTER — Encounter: Payer: Self-pay | Admitting: Family Medicine

## 2024-05-21 ENCOUNTER — Encounter: Payer: Self-pay | Admitting: Family Medicine

## 2024-05-21 DIAGNOSIS — Z1211 Encounter for screening for malignant neoplasm of colon: Secondary | ICD-10-CM

## 2024-06-14 LAB — COLOGUARD: COLOGUARD: NEGATIVE

## 2024-06-17 ENCOUNTER — Ambulatory Visit: Payer: Self-pay | Admitting: Family

## 2024-08-08 ENCOUNTER — Ambulatory Visit: Admitting: Dermatology

## 2025-02-04 ENCOUNTER — Encounter: Payer: PRIVATE HEALTH INSURANCE | Admitting: Family Medicine
